# Patient Record
Sex: Female | Born: 1965 | Race: Black or African American | Hispanic: No | Marital: Married | Smoking: Never smoker
Health system: Southern US, Community
[De-identification: ages and names within clinical notes are randomized; demographics above are authoritative.]

## PROBLEM LIST (undated history)

## (undated) DIAGNOSIS — E785 Hyperlipidemia, unspecified: Secondary | ICD-10-CM

## (undated) DIAGNOSIS — K219 Gastro-esophageal reflux disease without esophagitis: Secondary | ICD-10-CM

## (undated) DIAGNOSIS — E119 Type 2 diabetes mellitus without complications: Secondary | ICD-10-CM

## (undated) DIAGNOSIS — Z8759 Personal history of other complications of pregnancy, childbirth and the puerperium: Secondary | ICD-10-CM

## (undated) DIAGNOSIS — I10 Essential (primary) hypertension: Secondary | ICD-10-CM

## (undated) DIAGNOSIS — N979 Female infertility, unspecified: Secondary | ICD-10-CM

## (undated) DIAGNOSIS — M1990 Unspecified osteoarthritis, unspecified site: Secondary | ICD-10-CM

## (undated) DIAGNOSIS — I6529 Occlusion and stenosis of unspecified carotid artery: Secondary | ICD-10-CM

## (undated) DIAGNOSIS — N856 Intrauterine synechiae: Secondary | ICD-10-CM

## (undated) DIAGNOSIS — D250 Submucous leiomyoma of uterus: Secondary | ICD-10-CM

## (undated) DIAGNOSIS — R011 Cardiac murmur, unspecified: Secondary | ICD-10-CM

## (undated) DIAGNOSIS — D649 Anemia, unspecified: Secondary | ICD-10-CM

## (undated) DIAGNOSIS — Z973 Presence of spectacles and contact lenses: Secondary | ICD-10-CM

## (undated) HISTORY — PX: COLONOSCOPY: SHX174

## (undated) HISTORY — DX: Type 2 diabetes mellitus without complications: E11.9

## (undated) HISTORY — DX: Essential (primary) hypertension: I10

## (undated) HISTORY — DX: Unspecified osteoarthritis, unspecified site: M19.90

## (undated) HISTORY — PX: PITUITARY SURGERY: SHX203

## (undated) HISTORY — DX: Cardiac murmur, unspecified: R01.1

## (undated) HISTORY — DX: Hyperlipidemia, unspecified: E78.5

## (undated) HISTORY — DX: Anemia, unspecified: D64.9

---

## 2006-04-01 ENCOUNTER — Inpatient Hospital Stay (HOSPITAL_COMMUNITY): Admission: AD | Admit: 2006-04-01 | Discharge: 2006-04-01 | Payer: Self-pay | Admitting: Obstetrics and Gynecology

## 2006-04-01 ENCOUNTER — Ambulatory Visit: Payer: Self-pay | Admitting: Gynecology

## 2006-04-04 ENCOUNTER — Inpatient Hospital Stay (HOSPITAL_COMMUNITY): Admission: AD | Admit: 2006-04-04 | Discharge: 2006-04-04 | Payer: Self-pay | Admitting: Pediatrics

## 2006-04-07 ENCOUNTER — Inpatient Hospital Stay (HOSPITAL_COMMUNITY): Admission: AD | Admit: 2006-04-07 | Discharge: 2006-04-07 | Payer: Self-pay | Admitting: Family Medicine

## 2006-04-15 ENCOUNTER — Inpatient Hospital Stay (HOSPITAL_COMMUNITY): Admission: AD | Admit: 2006-04-15 | Discharge: 2006-04-15 | Payer: Self-pay | Admitting: Gynecology

## 2006-04-22 ENCOUNTER — Inpatient Hospital Stay (HOSPITAL_COMMUNITY): Admission: AD | Admit: 2006-04-22 | Discharge: 2006-04-22 | Payer: Self-pay | Admitting: Gynecology

## 2006-04-29 ENCOUNTER — Inpatient Hospital Stay (HOSPITAL_COMMUNITY): Admission: AD | Admit: 2006-04-29 | Discharge: 2006-04-29 | Payer: Self-pay | Admitting: Gynecology

## 2006-05-06 ENCOUNTER — Inpatient Hospital Stay (HOSPITAL_COMMUNITY): Admission: AD | Admit: 2006-05-06 | Discharge: 2006-05-06 | Payer: Self-pay | Admitting: Obstetrics and Gynecology

## 2006-05-10 ENCOUNTER — Inpatient Hospital Stay (HOSPITAL_COMMUNITY): Admission: AD | Admit: 2006-05-10 | Discharge: 2006-05-10 | Payer: Self-pay | Admitting: Obstetrics & Gynecology

## 2006-05-17 ENCOUNTER — Inpatient Hospital Stay (HOSPITAL_COMMUNITY): Admission: AD | Admit: 2006-05-17 | Discharge: 2006-05-17 | Payer: Self-pay | Admitting: Obstetrics & Gynecology

## 2006-05-24 ENCOUNTER — Inpatient Hospital Stay (HOSPITAL_COMMUNITY): Admission: AD | Admit: 2006-05-24 | Discharge: 2006-05-24 | Payer: Self-pay | Admitting: Obstetrics & Gynecology

## 2006-06-07 ENCOUNTER — Inpatient Hospital Stay (HOSPITAL_COMMUNITY): Admission: AD | Admit: 2006-06-07 | Discharge: 2006-06-07 | Payer: Self-pay | Admitting: Obstetrics & Gynecology

## 2006-06-21 ENCOUNTER — Inpatient Hospital Stay (HOSPITAL_COMMUNITY): Admission: AD | Admit: 2006-06-21 | Discharge: 2006-06-21 | Payer: Self-pay | Admitting: Family Medicine

## 2006-07-05 ENCOUNTER — Inpatient Hospital Stay (HOSPITAL_COMMUNITY): Admission: AD | Admit: 2006-07-05 | Discharge: 2006-07-05 | Payer: Self-pay | Admitting: Obstetrics & Gynecology

## 2006-07-12 ENCOUNTER — Inpatient Hospital Stay (HOSPITAL_COMMUNITY): Admission: AD | Admit: 2006-07-12 | Discharge: 2006-07-12 | Payer: Self-pay | Admitting: Family Medicine

## 2007-03-20 ENCOUNTER — Emergency Department (HOSPITAL_COMMUNITY): Admission: EM | Admit: 2007-03-20 | Discharge: 2007-03-20 | Payer: Self-pay | Admitting: Emergency Medicine

## 2007-05-01 ENCOUNTER — Inpatient Hospital Stay (HOSPITAL_COMMUNITY): Admission: AD | Admit: 2007-05-01 | Discharge: 2007-05-05 | Payer: Self-pay | Admitting: Obstetrics

## 2007-05-01 ENCOUNTER — Encounter: Payer: Self-pay | Admitting: Obstetrics & Gynecology

## 2007-08-28 ENCOUNTER — Encounter: Admission: RE | Admit: 2007-08-28 | Discharge: 2007-08-28 | Payer: Self-pay | Admitting: Obstetrics

## 2007-09-28 ENCOUNTER — Inpatient Hospital Stay (HOSPITAL_COMMUNITY): Admission: AD | Admit: 2007-09-28 | Discharge: 2007-10-02 | Payer: Self-pay | Admitting: Obstetrics

## 2011-03-06 NOTE — H&P (Signed)
NAMEAVRYL, ROEHM NO.:  [id number]   MEDICAL RECORD NO.:  [id number]          PATIENT TYPE:  INP   LOCATION:  9131                          FACILITY:  [facility]   PHYSICIAN:  Kathreen Cosier, M.D.DATE OF BIRTH:  02/13/1966   DATE OF ADMISSION:  09/28/2007  DATE OF DISCHARGE:                              HISTORY & PHYSICAL   PREOP HISTORY AND PHYSICAL   The patient is a 45 year old gravida 4, para 0-0-3-0, [facility] December 23,  who was admitted yesterday with ruptured membranes.  Patient received  Pitocin stimulation and progressed very slowly.  By 7 a.m. on December  8, she was 9.5 cm with the vertex molded to a 0 station.  She  subsequently became fully dilated and pushed 2 hours with the vertex  molded to a -1 station and it was decided she would be delivered by C-  section for CPD.   PHYSICAL EXAM:  Revealed a well-developed female in labor.  HEENT:  Negative.  LUNGS:  Clear.  HEART:  Regular rhythm.  No murmurs, no gallops.  BREASTS:  No masses.  ABDOMEN:  Term size uterus.  Estimated fetal weight 7 pounds.  PELVIC:  As described above.  EXTREMITIES:  Negative.           ______________________________  Kathreen Cosier, M.D.     BAM/MEDQ  D:  09/29/2007  T:  09/29/2007  Job:  [id number]

## 2011-03-06 NOTE — H&P (Signed)
NAMETOMI, GRANDPRE NO.:  [id number]   MEDICAL RECORD NO.:  [id number]          PATIENT TYPE:  INP   LOCATION:  9170                          [facility]ILITY[facility]  [facility]   PHYSICIAN:  Roseanna Rainbow, M.D.DATE OF BIRTH:  Jan 28, 1966   DATE OF ADMISSION:  09/28/2007  DATE OF DISCHARGE:                              HISTORY & PHYSICAL   CHIEF COMPLAINT:  The patient is a 45 year old gravida 4, para 0, with  an estimated date of confinement of October 14, 2007 with an  intrauterine pregnancy at 37+ weeks, complaining of rupture of  membranes.   HISTORY OF PRESENT ILLNESS:  The patient reports rupture of membranes  were clear. Fluid several hours prior to presentation.   PAST GYNECOLOGIC HISTORY:  Uterine fibroids.   PAST OBSTETRICAL HISTORY:  Voluntary termination of pregnancy,  spontaneous abortion incomplete with a D&C, and ectopic pregnancy  treated with methotrexate.   PAST SURGICAL HISTORY:  Please see the above.   PAST MEDICAL HISTORY:  Malaria.   FAMILY HISTORY:  Noncontributory.   SOCIAL HISTORY:  No tobacco, ethanol, or drug use.   OBSTETRICAL RISK FACTORS:  Advanced maternal age and fetus with right  renal agenesis. Antepartum course onset of care with Dr. Gaynell Face at 18  weeks.   PRENATAL LABORATORY DATA:  Hemoglobin 10.2. Hematocrit 31.4. Platelets  381,000. Blood type O positive. Antibody screen negative. Sickle cell  negative. RPR non-reactive. Rubella immune. Hepatitis B surface antigen  negative. HIV non-reactive. PPD positive. Pap smear normal. GC and  Chlamydia DNA post negative. Quad screen declined. One hour GCT 104. GBS  negative on September 09, 2007.   Ultrasound on July 10, 2007 at 26 weeks 2 day, normal AFI. No  previous. Estimated fetal weight percentile 51st percentile. Again,  right renal agenesis.   PHYSICAL EXAMINATION:  VITAL SIGNS:  Temperature 98, pulse 95,  respiratory rate 18, blood pressure 124/76. Fetal heart  tracing  reassuring. Toco odynometer irregular uterine contractions. Sterile  vaginal examination per the RN, the cervix is 1 cm dilated.   ASSESSMENT:  Intrauterine pregnancy at term, prodromal labor, fetal  heart tracing consistent with fetal well being.   PLAN:  Admission. Augmentation of labor with low-dose Pitocin per  protocol.      Roseanna Rainbow, M.D.  Electronically Signed     LAJ/MEDQ  D:  09/28/2007  T:  09/28/2007  Job:  [id number]

## 2011-03-06 NOTE — Op Note (Signed)
NAMEBEDA, DULA NO.:  [id number]   MEDICAL RECORD NO.:  [id number]          PATIENT TYPE:  INP   LOCATION:  9131                          [facility]  [facility]   PHYSICIAN:  Kathreen Cosier, M.D.DATE OF BIRTH:  1966-08-27   DATE OF PROCEDURE:  09/29/2007  DATE OF DISCHARGE:                               OPERATIVE REPORT   PREOPERATIVE DIAGNOSIS:  Failure to progress in labor.   POSTOPERATIVE DIAGNOSIS:  Failure to progress in labor.   SURGEON:  Kathreen Cosier, M.D.   ANESTHESIA:  Epidural.   PROCEDURE:  The patient placed on the operating room table in supine  position.  Abdomen prepped and draped.  Bladder emptied with a Foley  catheter.  A transverse suprapubic incision made, carried down through  the rectus fascia, fascia cleaned and incised the length of the  incision.  Recti muscles retracted laterally, peritoneum incised  longitudinally.  A transverse incision made into the visceral peritoneum  above the bladder, the bladder mobilized inferiorly.  A transverse lower  uterine incision made.  The patient delivered from the OP position of a  female, Apgar 9 and 9, weighing 6 pounds 13 ounces, the team in  attendance, the fluid clear.  The placenta was fundal and removed  manually and sent to labor and delivery.  The uterine cavity cleaned  with dry laps.  Uterine incision closed in one layer with continuous  suture of #1 chromic.  Hemostasis was satisfactory.  Bladder flap  repaired with 2-0 chromic.  The uterus well-contracted, tubes and  ovaries normal.  Abdomen closed in layers, peritoneum with continuous  suture of 0 chromic, fascia with continuous suture of 0 Dexon, and the  skin closed with a subcuticular stitch of 4-0 Monocryl.  Blood loss 500  mL.  The patient tolerated the procedure well, taken to the recovery  room in good condition.           ______________________________  Kathreen Cosier, M.D.     BAM/MEDQ  D:  09/29/2007   T:  09/29/2007  Job:  [id number]

## 2011-03-09 NOTE — Discharge Summary (Signed)
NAME:  MADELYNNE, LASKER NO.:  [id number]   MEDICAL RECORD NO.:  [id number]          PATIENT TYPE:  OUT   LOCATION:  MRI                          FACILITY:  [facility]   PHYSICIAN:  Kathreen Cosier, M.D.DATE OF BIRTH:  [birth year]   DATE OF ADMISSION:  05/01/2007  DATE OF DISCHARGE:  05/01/2007                               DISCHARGE SUMMARY   The patient is a 45 year old female, had [redacted] weeks gestation with a  history of myomas, and she was admitted with lower abdominal pain,  nausea and vomiting the day prior to admission.  The pain has become  progressively worse, and she was admitted for observation because of  degenerating myoma.  She was given Indocin for 48 hours and also started  on __________ .  She also has a history of chronic constipation, was  given prune juice and Dulcolax suppository.  She was discharged home on  May 05, 2007. Tylenol #3 for pain, to see me in 1 week.   DISCHARGE DIAGNOSIS:  IUP 16 weeks and degenerating myomas.           ______________________________  Kathreen Cosier, M.D.     BAM/MEDQ  D:  05/28/2007  T:  05/28/2007  Job:  [id number]

## 2011-03-09 NOTE — Discharge Summary (Signed)
NAME:  Rebecca Humphrey, Rebecca Humphrey NO.:  [id number]   MEDICAL RECORD NO.:  [id number]          PATIENT TYPE:  INP   LOCATION:  9131                          [facility]   PHYSICIAN:  Kathreen Cosier, M.D.DATE OF BIRTH:  1966/07/17   DATE OF ADMISSION:  09/28/2007  DATE OF DISCHARGE:  10/02/2007                               DISCHARGE SUMMARY   HOSPITAL COURSE:  This patient is a 45 year old primigravida who was  admitted in labor, and she became fully dilated and pushed for a couple  of hours and she underwent a primary low transverse cesarean section  because of failure to progress in labor.  She had a female, Apgar 9, and  weight 6 pounds 13 ounces from the OP position.  Post-op  she did well.  On admission her hemoglobin was 10.5, platelets 400, white count 8.2,  RPR negative, HIV negative.  Her hemoglobin was 6.6.  She was  asymptomatic and started on ferrous sulfate p.o. b.i.d.   She was discharged on the third postoperative day ambulatory, on a  regular diet, to see me in 6 weeks.   DISCHARGE DIAGNOSIS:  Status post primary low transverse cesarean  section at term because of failure to progress in labor.           ______________________________  Kathreen Cosier, M.D.     BAM/MEDQ  D:  10/14/2007  T:  10/15/2007  Job:  [id number]

## 2011-07-30 LAB — CBC
HCT: 19.7 — ABNORMAL LOW
HCT: 30.8 — ABNORMAL LOW
Hemoglobin: 10.5 — ABNORMAL LOW
Hemoglobin: 6.6 — CL
MCHC: 34.1
Platelets: 400
WBC: 8.2

## 2011-07-30 LAB — RPR: RPR Ser Ql: NONREACTIVE

## 2011-08-07 LAB — CBC
HCT: 32 — ABNORMAL LOW
MCHC: 33.4
MCV: 87.2
Platelets: 378
RBC: 3.67 — ABNORMAL LOW
RDW: 13.9
WBC: 11.4 — ABNORMAL HIGH

## 2011-08-07 LAB — URINALYSIS, ROUTINE W REFLEX MICROSCOPIC
Bilirubin Urine: NEGATIVE
Hgb urine dipstick: NEGATIVE
Ketones, ur: NEGATIVE
Protein, ur: NEGATIVE
pH: 7

## 2011-08-07 LAB — DIFFERENTIAL
Basophils Absolute: 0
Neutrophils Relative %: 82 — ABNORMAL HIGH

## 2013-11-05 ENCOUNTER — Other Ambulatory Visit: Payer: Self-pay | Admitting: Nurse Practitioner

## 2013-11-05 ENCOUNTER — Ambulatory Visit
Admission: RE | Admit: 2013-11-05 | Discharge: 2013-11-05 | Disposition: A | Discharge status: Home / Self Care | Payer: No Typology Code available for payment source | Source: Ambulatory Visit | Attending: Nurse Practitioner | Admitting: Nurse Practitioner

## 2013-11-05 ENCOUNTER — Other Ambulatory Visit: Payer: Self-pay | Admitting: *Deleted

## 2013-11-05 DIAGNOSIS — M25519 Pain in unspecified shoulder: Secondary | ICD-10-CM

## 2014-05-14 ENCOUNTER — Other Ambulatory Visit (HOSPITAL_COMMUNITY): Payer: Self-pay | Admitting: Internal Medicine

## 2014-05-14 DIAGNOSIS — I10 Essential (primary) hypertension: Secondary | ICD-10-CM

## 2014-05-14 DIAGNOSIS — I6529 Occlusion and stenosis of unspecified carotid artery: Secondary | ICD-10-CM

## 2014-05-18 ENCOUNTER — Ambulatory Visit (HOSPITAL_COMMUNITY): Payer: No Typology Code available for payment source

## 2014-05-18 ENCOUNTER — Ambulatory Visit (HOSPITAL_COMMUNITY): Admission: RE | Admit: 2014-05-18 | Payer: No Typology Code available for payment source | Source: Ambulatory Visit

## 2014-05-21 ENCOUNTER — Other Ambulatory Visit: Payer: Self-pay | Admitting: Internal Medicine

## 2014-05-21 ENCOUNTER — Ambulatory Visit (HOSPITAL_COMMUNITY)
Admission: RE | Admit: 2014-05-21 | Discharge: 2014-05-21 | Disposition: A | Discharge status: Home / Self Care | Payer: No Typology Code available for payment source | Source: Ambulatory Visit | Attending: Internal Medicine | Admitting: Internal Medicine

## 2014-05-21 DIAGNOSIS — I6529 Occlusion and stenosis of unspecified carotid artery: Secondary | ICD-10-CM | POA: Insufficient documentation

## 2014-05-21 DIAGNOSIS — R0609 Other forms of dyspnea: Secondary | ICD-10-CM | POA: Insufficient documentation

## 2014-05-21 DIAGNOSIS — I359 Nonrheumatic aortic valve disorder, unspecified: Secondary | ICD-10-CM

## 2014-05-21 DIAGNOSIS — I10 Essential (primary) hypertension: Secondary | ICD-10-CM | POA: Insufficient documentation

## 2014-05-21 DIAGNOSIS — N6310 Unspecified lump in the right breast, unspecified quadrant: Secondary | ICD-10-CM

## 2014-05-21 DIAGNOSIS — R0989 Other specified symptoms and signs involving the circulatory and respiratory systems: Principal | ICD-10-CM | POA: Insufficient documentation

## 2014-05-21 HISTORY — PX: TRANSTHORACIC ECHOCARDIOGRAM: SHX275

## 2014-05-21 NOTE — Progress Notes (Signed)
Bilateral carotid artery duplex completed:  1-39% ICA stenosis.  Vertebral artery flow is antegrade.     

## 2014-05-21 NOTE — Progress Notes (Signed)
  Echocardiogram 2D Echocardiogram has been performed.  Donata Clay 05/21/2014, 10:46 AM

## 2014-06-07 ENCOUNTER — Ambulatory Visit
Admission: RE | Admit: 2014-06-07 | Discharge: 2014-06-07 | Disposition: A | Discharge status: Home / Self Care | Payer: No Typology Code available for payment source | Source: Ambulatory Visit | Attending: Internal Medicine | Admitting: Internal Medicine

## 2014-06-07 DIAGNOSIS — N6310 Unspecified lump in the right breast, unspecified quadrant: Secondary | ICD-10-CM

## 2014-06-23 ENCOUNTER — Ambulatory Visit: Payer: No Typology Code available for payment source | Admitting: Obstetrics & Gynecology

## 2014-07-08 ENCOUNTER — Ambulatory Visit (INDEPENDENT_AMBULATORY_CARE_PROVIDER_SITE_OTHER): Payer: No Typology Code available for payment source | Admitting: Obstetrics & Gynecology

## 2014-07-08 ENCOUNTER — Encounter: Payer: Self-pay | Admitting: Obstetrics & Gynecology

## 2014-07-08 VITALS — Temp 99.5°F | Ht 63.0 in | Wt 196.0 lb

## 2014-07-08 DIAGNOSIS — Z01419 Encounter for gynecological examination (general) (routine) without abnormal findings: Secondary | ICD-10-CM

## 2014-07-08 DIAGNOSIS — N912 Amenorrhea, unspecified: Secondary | ICD-10-CM

## 2014-07-08 DIAGNOSIS — N951 Menopausal and female climacteric states: Secondary | ICD-10-CM

## 2014-07-08 LAB — POCT URINE PREGNANCY: Preg Test, Ur: NEGATIVE

## 2014-07-08 MED ORDER — NORETHIN ACE-ETH ESTRAD-FE 1-20 MG-MCG(24) PO TABS: 1.0000 | ORAL_TABLET | Freq: Every day | ORAL | Status: DC

## 2014-07-08 NOTE — Patient Instructions (Signed)
Perimenopause Perimenopause is the time when your body begins to move into the menopause (no menstrual period for 12 straight months). It is a natural process. Perimenopause can begin 2-8 years before the menopause and usually lasts for 1 year after the menopause. During this time, your ovaries may or may not produce an egg. The ovaries vary in their production of estrogen and progesterone hormones each month. This can cause irregular menstrual periods, difficulty getting pregnant, vaginal bleeding between periods, and uncomfortable symptoms. CAUSES  Irregular production of the ovarian hormones, estrogen and progesterone, and not ovulating every month.  Other causes include:  Tumor of the pituitary gland in the brain.  Medical disease that affects the ovaries.  Radiation treatment.  Chemotherapy.  Unknown causes.  Heavy smoking and excessive alcohol intake can bring on perimenopause sooner. SIGNS AND SYMPTOMS   Hot flashes.  Night sweats.  Irregular menstrual periods.  Decreased sex drive.  Vaginal dryness.  Headaches.  Mood swings.  Depression.  Memory problems.  Irritability.  Tiredness.  Weight gain.  Trouble getting pregnant.  The beginning of losing bone cells (osteoporosis).  The beginning of hardening of the arteries (atherosclerosis). DIAGNOSIS  Your health care provider will make a diagnosis by analyzing your age, menstrual history, and symptoms. He or she will do a physical exam and note any changes in your body, especially your female organs. Female hormone tests may or may not be helpful depending on the amount of female hormones you produce and when you produce them. However, other hormone tests may be helpful to rule out other problems. TREATMENT  In some cases, no treatment is needed. The decision on whether treatment is necessary during the perimenopause should be made by you and your health care provider based on how the symptoms are affecting you  and your lifestyle. Various treatments are available, such as:  Treating individual symptoms with a specific medicine for that symptom.  Herbal medicines that can help specific symptoms.  Counseling.  Group therapy. HOME CARE INSTRUCTIONS   Keep track of your menstrual periods (when they occur, how heavy they are, how long between periods, and how long they last) as well as your symptoms and when they started.  Only take over-the-counter or prescription medicines as directed by your health care provider.  Sleep and rest.  Exercise.  Eat a diet that contains calcium (good for your bones) and soy (acts like the estrogen hormone).  Do not smoke.  Avoid alcoholic beverages.  Take vitamin supplements as recommended by your health care provider. Taking vitamin E may help in certain cases.  Take calcium and vitamin D supplements to help prevent bone loss.  Group therapy is sometimes helpful.  Acupuncture may help in some cases. SEEK MEDICAL CARE IF:   You have questions about any symptoms you are having.  You need a referral to a specialist (gynecologist, psychiatrist, or psychologist). SEEK IMMEDIATE MEDICAL CARE IF:   You have vaginal bleeding.  Your period lasts longer than 8 days.  Your periods are recurring sooner than 21 days.  You have bleeding after intercourse.  You have severe depression.  You have pain when you urinate.  You have severe headaches.  You have vision problems. Document Released: 11/15/2004 Document Revised: 07/29/2013 Document Reviewed: 05/07/2013 [facility] Patient Information 2015 [place], [place]. This information is not intended to replace advice given to you by your health care provider. Make sure you discuss any questions you have with your health care provider. Perimenopause Perimenopause is the time  when your body begins to move into the menopause (no menstrual period for 12 straight months). It is a natural process. Perimenopause can  begin 2-8 years before the menopause and usually lasts for 1 year after the menopause. During this time, your ovaries may or may not produce an egg. The ovaries vary in their production of estrogen and progesterone hormones each month. This can cause irregular menstrual periods, difficulty getting pregnant, vaginal bleeding between periods, and uncomfortable symptoms. CAUSES  Irregular production of the ovarian hormones, estrogen and progesterone, and not ovulating every month.  Other causes include:  Tumor of the pituitary gland in the brain.  Medical disease that affects the ovaries.  Radiation treatment.  Chemotherapy.  Unknown causes.  Heavy smoking and excessive alcohol intake can bring on perimenopause sooner. SIGNS AND SYMPTOMS   Hot flashes.  Night sweats.  Irregular menstrual periods.  Decreased sex drive.  Vaginal dryness.  Headaches.  Mood swings.  Depression.  Memory problems.  Irritability.  Tiredness.  Weight gain.  Trouble getting pregnant.  The beginning of losing bone cells (osteoporosis).  The beginning of hardening of the arteries (atherosclerosis). DIAGNOSIS  Your health care provider will make a diagnosis by analyzing your age, menstrual history, and symptoms. He or she will do a physical exam and note any changes in your body, especially your female organs. Female hormone tests may or may not be helpful depending on the amount of female hormones you produce and when you produce them. However, other hormone tests may be helpful to rule out other problems. TREATMENT  In some cases, no treatment is needed. The decision on whether treatment is necessary during the perimenopause should be made by you and your health care provider based on how the symptoms are affecting you and your lifestyle. Various treatments are available, such as:  Treating individual symptoms with a specific medicine for that symptom.  Herbal medicines that can help  specific symptoms.  Counseling.  Group therapy. HOME CARE INSTRUCTIONS   Keep track of your menstrual periods (when they occur, how heavy they are, how long between periods, and how long they last) as well as your symptoms and when they started.  Only take over-the-counter or prescription medicines as directed by your health care provider.  Sleep and rest.  Exercise.  Eat a diet that contains calcium (good for your bones) and soy (acts like the estrogen hormone).  Do not smoke.  Avoid alcoholic beverages.  Take vitamin supplements as recommended by your health care provider. Taking vitamin E may help in certain cases.  Take calcium and vitamin D supplements to help prevent bone loss.  Group therapy is sometimes helpful.  Acupuncture may help in some cases. SEEK MEDICAL CARE IF:   You have questions about any symptoms you are having.  You need a referral to a specialist (gynecologist, psychiatrist, or psychologist). SEEK IMMEDIATE MEDICAL CARE IF:   You have vaginal bleeding.  Your period lasts longer than 8 days.  Your periods are recurring sooner than 21 days.  You have bleeding after intercourse.  You have severe depression.  You have pain when you urinate.  You have severe headaches.  You have vision problems. Document Released: 11/15/2004 Document Revised: 07/29/2013 Document Reviewed: 05/07/2013 [facility] Patient Information 2015 [place], [place]. This information is not intended to replace advice given to you by your health care provider. Make sure you discuss any questions you have with your health care provider.

## 2014-07-08 NOTE — Progress Notes (Signed)
Subjective:     Rebecca Humphrey is a 48 y.o. female here for a routine exam.  Current complaints: skipped menses   Personal health questionnaire:  Is patient Ashkenazi Jewish, have a family history of breast and/or ovarian cancer: no Is there a family history of uterine cancer diagnosed at age < 63, gastrointestinal cancer, urinary tract cancer, family member who is a Field seismologist syndrome-associated carrier: no Is the patient overweight and hypertensive, family history of diabetes, personal history of gestational diabetes or PCOS: yes Is patient over 2, have PCOS,  family history of premature CHD under age 25, diabetes, smoke, have hypertension or peripheral artery disease:  no At any time, has a partner hit, kicked or otherwise hurt or frightened you?: no Over the past 2 weeks, have you felt down, depressed or hopeless?: no Over the past 2 weeks, have you felt little interest or pleasure in doing things?:no   Gynecologic History Patient's last menstrual period was 10/24/2013. Last Pap results were: normal Last mammogram: 8/15. Results were: normal  Obstetric History OB History  Gravida Para Term Preterm AB SAB TAB Ectopic Multiple Living  4 1 1  3 2  1  1     # Outcome Date GA Lbr Len/2nd Weight Sex Delivery Anes PTL Lv  4 TRM 09/29/07 [redacted]w[redacted]d  2.977 kg (6 lb 9 oz) F LTCS Spinal  Y  3 ECT         N  2 SAB         N  1 SAB         N      Past Medical History  Diagnosis Date  . Hypertension     Past Surgical History  Procedure Laterality Date  . Cesarean section      Current outpatient prescriptions:aspirin 81 MG tablet, Take 81 mg by mouth daily., Disp: , Rfl: ;  hydrochlorothiazide (HYDRODIURIL) 25 MG tablet, Take 25 mg by mouth daily., Disp: , Rfl: ;  omeprazole (PRILOSEC) 20 MG capsule, Take 20 mg by mouth daily., Disp: , Rfl: ;  Norethindrone Acetate-Ethinyl Estrad-FE (LOESTRIN 24 FE) 1-20 MG-MCG(24) tablet, Take 1 tablet by mouth daily., Disp: 1 Package, Rfl: 4 No Known  Allergies  History  Substance Use Topics  . Smoking status: Never Smoker   . Smokeless tobacco: Never Used  . Alcohol Use: No    Family History  Problem Relation Age of Onset  . Diabetes Mother   . Heart disease Father   . Hypertension Father       Review of Systems  Constitutional: negative for fatigue and weight loss Respiratory: negative for cough and wheezing Cardiovascular: negative for chest pain, fatigue and palpitations Gastrointestinal: negative for abdominal pain and change in bowel habits Musculoskeletal:negative for myalgias Neurological: negative for gait problems and tremors Behavioral/Psych: negative for abusive relationship, depression Endocrine: negative for temperature intolerance   Genitourinary:positive for irregular menstrual periods; negative for genital lesions, hot flashes, sexual problems and vaginal discharge Integument/breast: negative for breast lump, breast tenderness, nipple discharge and skin lesion(s)    Objective:       Temp(Src) 99.5 F (37.5 C)  Ht 5\' 3"  (1.6 m)  Wt 88.905 kg (196 lb)  BMI 34.73 kg/m2  LMP 10/24/2013 General:   alert  Skin:   no rash or abnormalities  Lungs:   clear to auscultation bilaterally  Heart:   regular rate and rhythm, S1, S2 normal, no murmur, click, rub or gallop  Breasts:   normal without suspicious masses,  skin or nipple changes or axillary nodes  Abdomen:  normal findings: no organomegaly, soft, non-tender and no hernia  Pelvis:  External genitalia: normal general appearance Urinary system: urethral meatus normal and bladder without fullness, nontender Vaginal: normal without tenderness, induration or masses Cervix: normal appearance Adnexa: normal bimanual exam Uterus: anteverted and non-tender, normal size   Lab Review Urine pregnancy test Labs reviewed no Radiologic studies reviewed no     Assessment:    Healthy female exam.    Plan:    Education reviewed: calcium supplements, low fat,  low cholesterol diet and weight bearing exercise.   Meds ordered this encounter  Medications  . hydrochlorothiazide (HYDRODIURIL) 25 MG tablet    Sig: Take 25 mg by mouth daily.  Marland Kitchen aspirin 81 MG tablet    Sig: Take 81 mg by mouth daily.  Marland Kitchen omeprazole (PRILOSEC) 20 MG capsule    Sig: Take 20 mg by mouth daily.  . Norethindrone Acetate-Ethinyl Estrad-FE (LOESTRIN 24 FE) 1-20 MG-MCG(24) tablet    Sig: Take 1 tablet by mouth daily.    Dispense:  1 Package    Refill:  4   Orders Placed This Encounter  Procedures  . US Pelvis Complete    Standing Status: Future     Number of Occurrences:      Standing Expiration Date: 09/08/2015    Order Specific Question:  Reason for Exam (SYMPTOM  OR DIAGNOSIS REQUIRED)    Answer:  amenorrhea    Order Specific Question:  Preferred imaging location?    Answer:  [facility]  . [facility]  . Prolactin  . Estradiol  . POCT urine pregnancy   Need to obtain previous records Possible management options include: attempt to cycle with [facility] Follow up as needed.

## 2014-07-09 LAB — FOLLICLE STIMULATING HORMONE: FSH: 4.6 m[IU]/mL

## 2014-07-09 LAB — ESTRADIOL: ESTRADIOL: 101.7 pg/mL

## 2014-07-09 LAB — PROLACTIN: PROLACTIN: 23.5 ng/mL

## 2014-07-12 LAB — PAP IG AND HPV HIGH-RISK: HPV DNA HIGH RISK: NOT DETECTED

## 2014-07-15 ENCOUNTER — Other Ambulatory Visit: Payer: Self-pay | Admitting: Obstetrics & Gynecology

## 2014-07-15 DIAGNOSIS — N912 Amenorrhea, unspecified: Secondary | ICD-10-CM

## 2014-07-16 ENCOUNTER — Ambulatory Visit (HOSPITAL_COMMUNITY)
Admission: RE | Admit: 2014-07-16 | Discharge: 2014-07-16 | Disposition: A | Discharge status: Home / Self Care | Payer: No Typology Code available for payment source | Source: Ambulatory Visit | Attending: Obstetrics & Gynecology | Admitting: Obstetrics & Gynecology

## 2014-07-16 DIAGNOSIS — D251 Intramural leiomyoma of uterus: Secondary | ICD-10-CM | POA: Diagnosis not present

## 2014-07-16 DIAGNOSIS — N912 Amenorrhea, unspecified: Secondary | ICD-10-CM | POA: Insufficient documentation

## 2014-07-16 DIAGNOSIS — N854 Malposition of uterus: Secondary | ICD-10-CM | POA: Diagnosis not present

## 2014-07-21 ENCOUNTER — Ambulatory Visit (INDEPENDENT_AMBULATORY_CARE_PROVIDER_SITE_OTHER): Payer: No Typology Code available for payment source | Admitting: Obstetrics & Gynecology

## 2014-07-21 ENCOUNTER — Encounter: Payer: Self-pay | Admitting: Obstetrics & Gynecology

## 2014-07-21 VITALS — BP 128/81 | HR 66 | Temp 98.2°F | Wt 195.0 lb

## 2014-07-21 DIAGNOSIS — N979 Female infertility, unspecified: Secondary | ICD-10-CM

## 2014-07-21 DIAGNOSIS — D259 Leiomyoma of uterus, unspecified: Secondary | ICD-10-CM

## 2014-07-21 NOTE — Patient Instructions (Signed)
Infertility WHAT IS INFERTILITY?  Infertility is usually defined as not being able to get pregnant after trying for one year of regular sexual intercourse without the use of contraceptives. Or not being able to carry a pregnancy to term and have a baby. The infertility rate in the [place] is around 10%. Pregnancy is the result of a chain of events. A woman must release an egg from one of her ovaries (ovulation). The egg must be fertilized by the female sperm. Then it travels through a fallopian tube into the uterus (womb), where it attaches to the wall of the uterus and grows. A man must have enough sperm, and the sperm must join with (fertilize) the egg along the way, at the proper time. The fertilized egg must then become attached to the inside of the uterus. While this may seem simple, many things can happen to prevent pregnancy from occurring.  WHOSE PROBLEM IS IT?  About 20% of infertility cases are due to problems with the man (female factors) and 65% are due to problems with the woman (female factors). Other cases are due to a combination of female and female factors or to unknown causes.  WHAT CAUSES INFERTILITY IN MEN?  Infertility in men is often caused by problems with making enough normal sperm or getting the sperm to reach the egg. Problems with sperm may exist from birth or develop later in life, due to illness or injury. Some men produce no sperm, or produce too few sperm (oligospermia). Other problems include:  Sexual dysfunction.  Hormonal or endocrine problems.  Age. Female fertility decreases with age, but not at as young an age as female fertility.  Infection.  Congenital problems. Birth defect, such as absence of the tubes that carry the sperm (vas deferens).  Genetic/chromosomal problems.  Antisperm antibody problems.  Retrograde ejaculation (sperm go into the bladder).  Varicoceles, spematoceles, or tumors of the testicles.  Lifestyle can influence the number and  quality of a man's sperm.  Alcohol and drugs can temporarily reduce sperm quality.  Environmental toxins, including pesticides and lead, may cause some cases of infertility in men. WHAT CAUSES INFERTILITY IN WOMEN?   Problems with ovulation account for most infertility in women. Without ovulation, eggs are not available to be fertilized.  Signs of problems with ovulation include irregular menstrual periods or no periods at all.  Simple lifestyle factors, including stress, diet, or athletic training, can affect a woman's hormonal balance.  Age. Fertility begins to decrease in women in the early 76s and is worse after age 71.  Much less often, a hormonal imbalance from a serious medical problem, such as a pituitary gland tumor, thyroid or other chronic medical disease, can cause ovulation problems.  Pelvic infections.  Polycystic ovary syndrome (increase in female hormones, unable to ovulate).  Alcohol or illegal drugs.  Environmental toxins, radiation, pesticides, and certain chemicals.  Aging is an important factor in female infertility.  The ability of a woman's ovaries to produce eggs declines with age, especially after age 27. About one third of couples where the woman is over 21 will have problems with fertility.  By the time she reaches menopause when her monthly periods stop for good, a woman can no longer produce eggs or become pregnant.  Other problems can also lead to infertility in women. If the fallopian tubes are blocked at one or both ends, the egg cannot travel through the tubes into the uterus. Scar tissue (adhesions) in the pelvis may cause blocked  tubes. This may result from pelvic inflammatory disease, endometriosis, or surgery for an ectopic pregnancy (fertilized egg implanted outside the uterus) or any pelvic or abdominal surgery causing adhesions.  Fibroid tumors or polyps of the uterus.  Congenital (birth defect) abnormalities of the uterus.  Infection of the  cervix (cervicitis).  Cervical stenosis (narrowing).  Abnormal cervical mucus.  Polycystic ovary syndrome.  Having sexual intercourse too often (every other day or 4 to 5 times a week).  Obesity.  Anorexia.  Poor nutrition.  Over exercising, with loss of body fat.  DES. Your mother received diethylstilbesterol hormone when pregnant with you. HOW IS INFERTILITY TESTED?  If you have been trying to have a baby without success, you may want to seek medical help. You should not wait for one year of trying before seeing a health care provider if:  You are over 35.  You have reason to believe that there may be a fertility problem. A medical evaluation may determine the reasons for a couple's infertility. Usually this process begins with:  Physical exams.  Medical histories of both partners.  Sexual histories of both partners. If there is no obvious problem, like improperly timed intercourse or absence of ovulation, tests may be needed.   For a man, testing usually begins with tests of his semen to look at:  The number of sperm.  The shape of sperm.  Movement of his sperm.  Taking a complete medical and surgical history.  Physical examination.  Check for infection of the female reproductive organs. Sometimes hormone tests are done.   For a woman, the first step in testing is to find out if she is ovulating each month. There are several ways to do this. For example, she can keep track of changes in her morning body temperature and in the texture of her cervical mucus. Another tool is a home ovulation test kit, which can be bought at drug or grocery stores.  Checks of ovulation can also be done in the doctor's office, using blood tests for hormone levels or ultrasound tests of the ovaries. If the woman is ovulating, more tests will need to be done. Some common female tests include:  Hysterosalpingogram: An x-ray of the fallopian tubes and uterus after they are injected with  dye. It shows if the tubes are open and shows the shape of the uterus.  Laparoscopy: An exam of the tubes and other female organs for disease. A lighted tube called a laparoscope is used to see inside the abdomen.  Endometrial biopsy: Sample of uterus tissue taken on the first day of the menstrual period, to see if the tissue indicates you are ovulating.  Transvaginal ultrasound: Examines the female organs.  Hysteroscopy: Uses a lighted tube to examine the cervix and inside the uterus, to see if there are any abnormalities inside the uterus. TREATMENT  Depending on the test results, different treatments can be suggested. The type of treatment depends on the cause. 85 to 90% of infertility cases are treated with drugs or surgery.   Various fertility drugs may be used for women with ovulation problems. It is important to talk with your caregiver about the drug to be used. You should understand the drug's benefits and side effects. Depending on the type of fertility drug and the dosage of the drug used, multiple births (twins or multiples) can occur in some women.  If needed, surgery can be done to repair damage to a woman's ovaries, fallopian tubes, cervix, or uterus.  Surgery  or medical treatment for endometriosis or polycystic ovary syndrome. Sometimes a man has an infertility problem that can be corrected with medicine or by surgery.  Intrauterine insemination (IUI) of sperm, timed with ovulation.  Change in lifestyle, if that is the cause (lose weight, increase exercise, and stop smoking, drinking excessively, or taking illegal drugs).  Other types of surgery:  Removing growths inside and on the uterus.  Removing scar tissue from inside of the uterus.  Fixing blocked tubes.  Removing scar tissue in the pelvis and around the female organs. WHAT IS ASSISTED REPRODUCTIVE TECHNOLOGY (ART)?  Assisted reproductive technology (ART) is another form of special methods used to help infertile  couples. ART involves handling both the woman's eggs and the man's sperm. Success rates vary and depend on many factors. ART can be expensive and time-consuming. But ART has made it possible for many couples to have children that otherwise would not have been conceived. Some methods are listed below:  In vitro fertilization (IVF). IVF is often used when a woman's fallopian tubes are blocked or when a man has low sperm counts. A drug is used to stimulate the ovaries to produce multiple eggs. Once mature, the eggs are removed and placed in a culture dish with the man's sperm for fertilization. After about 40 hours, the eggs are examined to see if they have become fertilized by the sperm and are dividing into cells. These fertilized eggs (embryos) are then placed in the woman's uterus. This bypasses the fallopian tubes.  Gamete intrafallopian transfer (GIFT) is similar to IVF, but used when the woman has at least one normal fallopian tube. Three to five eggs are placed in the fallopian tube, along with the man's sperm, for fertilization inside the woman's body.  Zygote intrafallopian transfer (ZIFT), also called tubal embryo transfer, combines IVF and GIFT. The eggs retrieved from the woman's ovaries are fertilized in the lab and placed in the fallopian tubes rather than in the uterus.  ART procedures sometimes involve the use of donor eggs (eggs from another woman) or previously frozen embryos. Donor eggs may be used if a woman has impaired ovaries or has a genetic disease that could be passed on to her baby.  When performing ART, you are at higher risk for resulting in multiple pregnancies, twins, triplets or more.  Intracytoplasma sperm injection is a procedure that injects a single sperm into the egg to fertilize it.  Embryo transplant is a procedure that starts after growing an embryo in a special media (chemical solution) developed to keep the embryo alive for 2 to 5 days, and then transplanting it  into the uterus. In cases where a cause cannot be found and pregnancy does not occur, adoption may be a consideration. Document Released: 10/11/2003 Document Revised: 12/31/2011 Document Reviewed: 09/06/2009 [facility] Patient Information 2015 [place], [place]. This information is not intended to replace advice given to you by your health care provider. Make sure you discuss any questions you have with your health care provider.

## 2014-07-23 ENCOUNTER — Telehealth: Payer: Self-pay

## 2014-07-23 DIAGNOSIS — D219 Benign neoplasm of connective and other soft tissue, unspecified: Secondary | ICD-10-CM | POA: Insufficient documentation

## 2014-07-23 NOTE — Progress Notes (Signed)
Patient ID: Rebecca Humphrey, female   DOB: 05/15/1966, 48 y.o.   MRN: [id number]  Chief Complaint  Patient presents with  . Follow-up    u/s    HPI Rebecca Humphrey is a 48 y.o. female.  She has recently had a withdrawal bleed/menses.  The ultrasound showed a small, fibroid uterus. HPI  Past Medical History  Diagnosis Date  . Hypertension     Past Surgical History  Procedure Laterality Date  . Cesarean section      Family History  Problem Relation Age of Onset  . Diabetes Mother   . Heart disease Father   . Hypertension Father     Social History History  Substance Use Topics  . Smoking status: Never Smoker   . Smokeless tobacco: Never Used  . Alcohol Use: No    No Known Allergies  Current Outpatient Prescriptions  Medication Sig Dispense Refill  . aspirin 81 MG tablet Take 81 mg by mouth daily.      . hydrochlorothiazide (HYDRODIURIL) 25 MG tablet Take 25 mg by mouth daily.      . Norethindrone Acetate-Ethinyl Estrad-FE (LOESTRIN 24 FE) 1-20 MG-MCG(24) tablet Take 1 tablet by mouth daily.  1 Package  4  . omeprazole (PRILOSEC) 20 MG capsule Take 20 mg by mouth daily.      . potassium chloride (K-DUR) 10 MEQ tablet        No current facility-administered medications for this visit.    Review of Systems Review of Systems Constitutional: negative for fatigue and weight loss Respiratory: negative for cough and wheezing Cardiovascular: negative for chest pain, fatigue and palpitations Gastrointestinal: negative for abdominal pain and change in bowel habits Genitourinary:negative Integument/breast: negative for nipple discharge Musculoskeletal:negative for myalgias Neurological: negative for gait problems and tremors Behavioral/Psych: negative for abusive relationship, depression Endocrine: negative for temperature intolerance     Blood pressure 128/81, pulse 66, temperature 98.2 F (36.8 C), weight 88.451 kg (195 lb), last menstrual period  10/24/2013.  Physical Exam Physical Exam   50% of 15 min visit spent on counseling and coordination of care.   Data Reviewed Pelvic U/S, labs  Assessment    Small fibroid The patient is considering her fertility options--counseled re: the impact of age; her partner's age is >50    Plan    Orders Placed This Encounter  Procedures  . Anti mullerian hormone  . Ambulatory referral to Endocrinology    Referral Priority:  Routine    Referral Type:  Consultation    Referral Reason:  Specialty Services Required    Requested Specialty:  Reproductive Endocrinology and Infertility    Number of Visits Requested:  1    Follow up as needed.         JACKSON-MOORE,Pegeen Stiger A 07/23/2014, 5:35 PM

## 2014-07-23 NOTE — Telephone Encounter (Signed)
old patient that Dr. Charlett Lango office was ttrying to reach her to schedule appt with them and to call Seth Bake at [phone] today

## 2014-07-24 LAB — ANTI MULLERIAN HORMONE

## 2014-08-23 ENCOUNTER — Encounter: Payer: Self-pay | Admitting: Obstetrics & Gynecology

## 2014-08-31 ENCOUNTER — Other Ambulatory Visit: Payer: No Typology Code available for payment source

## 2014-09-01 ENCOUNTER — Other Ambulatory Visit: Payer: No Typology Code available for payment source

## 2014-09-01 DIAGNOSIS — N979 Female infertility, unspecified: Secondary | ICD-10-CM

## 2014-09-02 LAB — PROLACTIN: Prolactin: 25.7 ng/mL

## 2014-10-18 ENCOUNTER — Encounter: Payer: Self-pay | Admitting: *Deleted

## 2014-10-19 ENCOUNTER — Encounter: Payer: Self-pay | Admitting: Obstetrics & Gynecology

## 2014-11-19 ENCOUNTER — Encounter (HOSPITAL_BASED_OUTPATIENT_CLINIC_OR_DEPARTMENT_OTHER): Payer: Self-pay | Admitting: *Deleted

## 2014-11-23 ENCOUNTER — Encounter (HOSPITAL_BASED_OUTPATIENT_CLINIC_OR_DEPARTMENT_OTHER): Payer: Self-pay | Admitting: *Deleted

## 2014-11-23 NOTE — Progress Notes (Signed)
NPO AFTER MN WITH EXCEPTION CLEAR LIQUIDS UNTIL 0830 (NO CREAM/ MILK PRODUCTS).  ARRIVE AT 1300.  NEEDS ISTAT AND EKG. WILL TAKE PRILOSEC AM DOS W/ SIPS OF WATER. 

## 2014-11-25 ENCOUNTER — Ambulatory Visit (HOSPITAL_BASED_OUTPATIENT_CLINIC_OR_DEPARTMENT_OTHER)
Admission: RE | Admit: 2014-11-25 | Payer: No Typology Code available for payment source | Source: Ambulatory Visit | Admitting: Obstetrics and Gynecology

## 2014-11-25 HISTORY — DX: Gastro-esophageal reflux disease without esophagitis: K21.9

## 2014-11-25 HISTORY — DX: Submucous leiomyoma of uterus: D25.0

## 2014-11-25 HISTORY — DX: Personal history of other complications of pregnancy, childbirth and the puerperium: Z87.59

## 2014-11-25 HISTORY — DX: Occlusion and stenosis of unspecified carotid artery: I65.29

## 2014-11-25 HISTORY — DX: Intrauterine synechiae: N85.6

## 2014-11-25 HISTORY — DX: Female infertility, unspecified: N97.9

## 2014-11-25 HISTORY — DX: Presence of spectacles and contact lenses: Z97.3

## 2014-11-25 SURGERY — HYSTEROSCOPY
Anesthesia: General

## 2015-01-20 ENCOUNTER — Other Ambulatory Visit: Payer: Self-pay | Admitting: *Deleted

## 2015-01-20 DIAGNOSIS — Z30011 Encounter for initial prescription of contraceptive pills: Secondary | ICD-10-CM

## 2015-01-20 MED ORDER — NORETHIN ACE-ETH ESTRAD-FE 1-20 MG-MCG(24) PO TABS: 1.0000 | ORAL_TABLET | Freq: Every day | ORAL | Status: DC

## 2015-01-20 NOTE — Progress Notes (Signed)
Patient sent in a refill request from the pharmacy. Patient requesting a refill on her birth control. Patient was in office last for an appointment 07-22-15. Refills given to last until annual exam due.

## 2015-08-27 ENCOUNTER — Other Ambulatory Visit: Payer: Self-pay | Admitting: Internal Medicine

## 2015-08-27 DIAGNOSIS — E2839 Other primary ovarian failure: Secondary | ICD-10-CM

## 2015-09-26 ENCOUNTER — Ambulatory Visit
Admission: RE | Admit: 2015-09-26 | Discharge: 2015-09-26 | Disposition: A | Discharge status: Home / Self Care | Payer: Commercial Managed Care - PPO | Source: Ambulatory Visit | Attending: Internal Medicine | Admitting: Internal Medicine

## 2015-09-26 DIAGNOSIS — E2839 Other primary ovarian failure: Secondary | ICD-10-CM

## 2016-02-13 ENCOUNTER — Encounter: Payer: Self-pay | Admitting: Gastroenterology

## 2016-04-03 ENCOUNTER — Ambulatory Visit (AMBULATORY_SURGERY_CENTER): Payer: Self-pay | Admitting: *Deleted

## 2016-04-03 ENCOUNTER — Encounter: Payer: Self-pay | Admitting: *Deleted

## 2016-04-03 VITALS — Ht 63.0 in | Wt 203.0 lb

## 2016-04-03 DIAGNOSIS — Z1211 Encounter for screening for malignant neoplasm of colon: Secondary | ICD-10-CM

## 2016-04-03 NOTE — Progress Notes (Signed)
No egg or soy allergy known to patient  No issues with past sedation with any surgeries  or procedures, no intubation problems  No diet pills per patient No home 02 use per patient  No blood thinners per patient  Pt denies issues with constipation  emmi declined

## 2016-04-04 ENCOUNTER — Encounter: Payer: Self-pay | Admitting: Gastroenterology

## 2016-04-13 ENCOUNTER — Ambulatory Visit (AMBULATORY_SURGERY_CENTER): Payer: Commercial Managed Care - PPO | Admitting: Gastroenterology

## 2016-04-13 ENCOUNTER — Encounter: Payer: Self-pay | Admitting: Gastroenterology

## 2016-04-13 VITALS — BP 124/74 | HR 49 | Temp 96.2°F | Resp 11 | Ht 63.0 in | Wt 203.0 lb

## 2016-04-13 DIAGNOSIS — D123 Benign neoplasm of transverse colon: Secondary | ICD-10-CM | POA: Diagnosis not present

## 2016-04-13 DIAGNOSIS — Z1211 Encounter for screening for malignant neoplasm of colon: Secondary | ICD-10-CM | POA: Diagnosis present

## 2016-04-13 DIAGNOSIS — D124 Benign neoplasm of descending colon: Secondary | ICD-10-CM | POA: Diagnosis not present

## 2016-04-13 MED ORDER — SODIUM CHLORIDE 0.9 % IV SOLN: 500.0000 mL | INTRAVENOUS | Status: DC

## 2016-04-13 NOTE — Patient Instructions (Signed)
YOU HAD AN ENDOSCOPIC PROCEDURE TODAY AT [facility]:   Refer to the procedure report that was given to you for any specific questions about what was found during the examination.  If the procedure report does not answer your questions, please call your gastroenterologist to clarify.  If you requested that your care partner not be given the details of your procedure findings, then the procedure report has been included in a sealed envelope for you to review at your convenience later.  YOU SHOULD EXPECT: Some feelings of bloating in the abdomen. Passage of more gas than usual.  Walking can help get rid of the air that was put into your GI tract during the procedure and reduce the bloating. If you had a lower endoscopy (such as a colonoscopy or flexible sigmoidoscopy) you may notice spotting of blood in your stool or on the toilet paper. If you underwent a bowel prep for your procedure, you may not have a normal bowel movement for a few days.  Please Note:  You might notice some irritation and congestion in your nose or some drainage.  This is from the oxygen used during your procedure.  There is no need for concern and it should clear up in a day or so.  SYMPTOMS TO REPORT IMMEDIATELY:   Following lower endoscopy (colonoscopy or flexible sigmoidoscopy):  Excessive amounts of blood in the stool  Significant tenderness or worsening of abdominal pains  Swelling of the abdomen that is new, acute  Fever of 100F or higher   For urgent or emergent issues, a gastroenterologist can be reached at any hour by calling [phone].   DIET: Your first meal following the procedure should be a small meal and then it is ok to progress to your normal diet. Heavy or fried foods are harder to digest and may make you feel nauseous or bloated.  Likewise, meals heavy in dairy and vegetables can increase bloating.  Drink plenty of fluids but you should avoid alcoholic beverages for 24 hours. Try to  increase the fiber n your diet, and drink plenty of water.  ACTIVITY:  You should plan to take it easy for the rest of today and you should NOT DRIVE or use heavy machinery until tomorrow (because of the sedation medicines used during the test).    FOLLOW UP: Our staff will call the number listed on your records the next business day following your procedure to check on you and address any questions or concerns that you may have regarding the information given to you following your procedure. If we do not reach you, we will leave a message.  However, if you are feeling well and you are not experiencing any problems, there is no need to return our call.  We will assume that you have returned to your regular daily activities without incident.  If any biopsies were taken you will be contacted by phone or by letter within the next 1-3 weeks.  Please call us at [phone] if you have not heard about the biopsies in 3 weeks.    SIGNATURES/CONFIDENTIALITY: You and/or your care partner have signed paperwork which will be entered into your electronic medical record.  These signatures attest to the fact that that the information above on your After Visit Summary has been reviewed and is understood.  Full responsibility of the confidentiality of this discharge information lies with you and/or your care-partner.  Read all of the handouts given to you by your recovery  room nurse.  Thank-you for choosing us for your healthcare needs today. 

## 2016-04-13 NOTE — Op Note (Signed)
[facility] Patient Name: Rebecca Humphrey Procedure Date: 04/13/2016 8:42 AM MRN: [id number] Endoscopist: Mallie Mussel L. Loletha Carrow , MD Age: 50 Referring MD:  Date of Birth: [birth year] Gender: Female Account #: [id number] Procedure:                Colonoscopy Indications:              Screening for colorectal malignant neoplasm, This                            is the patient's first colonoscopy Medicines:                Monitored Anesthesia Care Procedure:                Pre-Anesthesia Assessment:                           - Prior to the procedure, a History and Physical                            was performed, and patient medications and                            allergies were reviewed. The patient's tolerance of                            previous anesthesia was also reviewed. The risks                            and benefits of the procedure and the sedation                            options and risks were discussed with the patient.                            All questions were answered, and informed consent                            was obtained. Prior Anticoagulants: The patient has                            taken no previous anticoagulant or antiplatelet                            agents. ASA Grade Assessment: II - A patient with                            mild systemic disease. After reviewing the risks                            and benefits, the patient was deemed in                            satisfactory condition to undergo the procedure.  After obtaining informed consent, the colonoscope                            was passed under direct vision. Throughout the                            procedure, the patient's blood pressure, pulse, and                            oxygen saturations were monitored continuously. The                            Model PCF-H190DL [id number]) scope was introduced                            through the anus and  advanced to the the cecum,                            identified by appendiceal orifice and ileocecal                            valve. The colonoscopy was performed without                            difficulty. The patient tolerated the procedure                            well. The quality of the bowel preparation was                            good. The ileocecal valve, appendiceal orifice, and                            rectum were photographed. The bowel preparation                            used was Miralax. Scope In: 8:52:51 AM Scope Out: 9:09:28 AM Scope Withdrawal Time: 0 hours 13 minutes 34 seconds  Total Procedure Duration: 0 hours 16 minutes 37 seconds  Findings:                 The perianal and digital rectal examinations were                            normal.                           Two sessile polyps were found in the mid descending                            colon and proximal transverse colon. The polyps                            were 2 mm in size. These polyps were removed with a  piecemeal technique using a cold biopsy forceps.                            Resection and retrieval were complete.                           Multiple medium-mouthed diverticula were found in                            the right colon.                           The exam was otherwise without abnormality on                            direct and retroflexion views. Complications:            No immediate complications. Estimated Blood Loss:     Estimated blood loss: none. Impression:               - Two 2 mm polyps in the mid descending colon and                            in the proximal transverse colon, removed piecemeal                            using a cold biopsy forceps. Resected and retrieved.                           - Diverticulosis in the right colon.                           - The examination was otherwise normal on direct                             and retroflexion views. Recommendation:           - Patient has a contact number available for                            emergencies. The signs and symptoms of potential                            delayed complications were discussed with the                            patient. Return to normal activities tomorrow.                            Written discharge instructions were provided to the                            patient.                           - Resume previous diet.                           -  Continue present medications.                           - Await pathology results.                           - Repeat colonoscopy is recommended for                            surveillance. The colonoscopy date will be                            determined after pathology results from today's                            exam become available for review. Cornell Gaber L. Loletha Carrow, MD 04/13/2016 9:13:47 AM This report has been signed electronically.

## 2016-04-13 NOTE — Progress Notes (Signed)
Called to room to assist during endoscopic procedure.  Patient ID and intended procedure confirmed with present staff. Received instructions for my participation in the procedure from the performing physician.  

## 2016-04-13 NOTE — Progress Notes (Signed)
Report to PACU, RN, vss, BBS= Clear.  

## 2016-04-16 ENCOUNTER — Telehealth: Payer: Self-pay | Admitting: *Deleted

## 2016-04-16 NOTE — Telephone Encounter (Signed)
No answer, left message to call if questions or concerns. 

## 2016-04-19 ENCOUNTER — Encounter: Payer: Self-pay | Admitting: Gastroenterology

## 2018-02-13 DIAGNOSIS — E7849 Other hyperlipidemia: Secondary | ICD-10-CM | POA: Diagnosis not present

## 2018-02-13 DIAGNOSIS — I10 Essential (primary) hypertension: Secondary | ICD-10-CM | POA: Diagnosis not present

## 2018-02-13 DIAGNOSIS — R7303 Prediabetes: Secondary | ICD-10-CM | POA: Diagnosis not present

## 2018-02-13 DIAGNOSIS — Z0001 Encounter for general adult medical examination with abnormal findings: Secondary | ICD-10-CM | POA: Diagnosis not present

## 2018-02-13 DIAGNOSIS — Z0000 Encounter for general adult medical examination without abnormal findings: Secondary | ICD-10-CM | POA: Diagnosis not present

## 2018-04-08 DIAGNOSIS — H8113 Benign paroxysmal vertigo, bilateral: Secondary | ICD-10-CM | POA: Diagnosis not present

## 2018-04-08 DIAGNOSIS — I10 Essential (primary) hypertension: Secondary | ICD-10-CM | POA: Diagnosis not present

## 2018-04-08 DIAGNOSIS — Z418 Encounter for other procedures for purposes other than remedying health state: Secondary | ICD-10-CM | POA: Diagnosis not present

## 2018-04-08 DIAGNOSIS — R7303 Prediabetes: Secondary | ICD-10-CM | POA: Diagnosis not present

## 2018-08-19 DIAGNOSIS — E669 Obesity, unspecified: Secondary | ICD-10-CM | POA: Diagnosis not present

## 2018-08-19 DIAGNOSIS — I10 Essential (primary) hypertension: Secondary | ICD-10-CM | POA: Diagnosis not present

## 2018-08-19 DIAGNOSIS — R7303 Prediabetes: Secondary | ICD-10-CM | POA: Diagnosis not present

## 2018-08-19 DIAGNOSIS — E7849 Other hyperlipidemia: Secondary | ICD-10-CM | POA: Diagnosis not present

## 2018-08-20 ENCOUNTER — Other Ambulatory Visit: Payer: Self-pay | Admitting: Internal Medicine

## 2018-08-20 DIAGNOSIS — E2839 Other primary ovarian failure: Secondary | ICD-10-CM

## 2018-08-22 DIAGNOSIS — Z6836 Body mass index (BMI) 36.0-36.9, adult: Secondary | ICD-10-CM | POA: Diagnosis not present

## 2018-08-22 DIAGNOSIS — Z01419 Encounter for gynecological examination (general) (routine) without abnormal findings: Secondary | ICD-10-CM | POA: Diagnosis not present

## 2018-08-22 DIAGNOSIS — Z124 Encounter for screening for malignant neoplasm of cervix: Secondary | ICD-10-CM | POA: Diagnosis not present

## 2018-08-22 DIAGNOSIS — Z1231 Encounter for screening mammogram for malignant neoplasm of breast: Secondary | ICD-10-CM | POA: Diagnosis not present

## 2018-10-01 ENCOUNTER — Ambulatory Visit
Admission: RE | Admit: 2018-10-01 | Discharge: 2018-10-01 | Disposition: A | Discharge status: Home / Self Care | Payer: BLUE CROSS/BLUE SHIELD | Source: Ambulatory Visit | Attending: Internal Medicine | Admitting: Internal Medicine

## 2018-10-01 DIAGNOSIS — Z780 Asymptomatic menopausal state: Secondary | ICD-10-CM | POA: Diagnosis not present

## 2018-10-01 DIAGNOSIS — M8588 Other specified disorders of bone density and structure, other site: Secondary | ICD-10-CM | POA: Diagnosis not present

## 2018-10-01 DIAGNOSIS — E2839 Other primary ovarian failure: Secondary | ICD-10-CM

## 2019-02-17 DIAGNOSIS — E7849 Other hyperlipidemia: Secondary | ICD-10-CM | POA: Diagnosis not present

## 2019-02-17 DIAGNOSIS — R7303 Prediabetes: Secondary | ICD-10-CM | POA: Diagnosis not present

## 2019-02-17 DIAGNOSIS — Z0000 Encounter for general adult medical examination without abnormal findings: Secondary | ICD-10-CM | POA: Diagnosis not present

## 2019-02-17 DIAGNOSIS — I10 Essential (primary) hypertension: Secondary | ICD-10-CM | POA: Diagnosis not present

## 2019-02-19 DIAGNOSIS — E7849 Other hyperlipidemia: Secondary | ICD-10-CM | POA: Diagnosis not present

## 2019-02-19 DIAGNOSIS — Z0000 Encounter for general adult medical examination without abnormal findings: Secondary | ICD-10-CM | POA: Diagnosis not present

## 2019-05-19 DIAGNOSIS — K219 Gastro-esophageal reflux disease without esophagitis: Secondary | ICD-10-CM | POA: Diagnosis not present

## 2019-05-19 DIAGNOSIS — I10 Essential (primary) hypertension: Secondary | ICD-10-CM | POA: Diagnosis not present

## 2019-05-19 DIAGNOSIS — E7849 Other hyperlipidemia: Secondary | ICD-10-CM | POA: Diagnosis not present

## 2019-05-19 DIAGNOSIS — H8113 Benign paroxysmal vertigo, bilateral: Secondary | ICD-10-CM | POA: Diagnosis not present

## 2019-08-18 DIAGNOSIS — M545 Low back pain: Secondary | ICD-10-CM | POA: Diagnosis not present

## 2019-08-18 DIAGNOSIS — I10 Essential (primary) hypertension: Secondary | ICD-10-CM | POA: Diagnosis not present

## 2019-08-18 DIAGNOSIS — E1165 Type 2 diabetes mellitus with hyperglycemia: Secondary | ICD-10-CM | POA: Diagnosis not present

## 2019-08-18 DIAGNOSIS — M179 Osteoarthritis of knee, unspecified: Secondary | ICD-10-CM | POA: Diagnosis not present

## 2019-10-05 DIAGNOSIS — Z1231 Encounter for screening mammogram for malignant neoplasm of breast: Secondary | ICD-10-CM | POA: Diagnosis not present

## 2019-10-05 DIAGNOSIS — N951 Menopausal and female climacteric states: Secondary | ICD-10-CM | POA: Diagnosis not present

## 2019-10-05 DIAGNOSIS — Z1213 Encounter for screening for malignant neoplasm of small intestine: Secondary | ICD-10-CM | POA: Diagnosis not present

## 2019-10-05 DIAGNOSIS — Z01419 Encounter for gynecological examination (general) (routine) without abnormal findings: Secondary | ICD-10-CM | POA: Diagnosis not present

## 2019-10-05 DIAGNOSIS — Z124 Encounter for screening for malignant neoplasm of cervix: Secondary | ICD-10-CM | POA: Diagnosis not present

## 2019-10-05 DIAGNOSIS — Z6836 Body mass index (BMI) 36.0-36.9, adult: Secondary | ICD-10-CM | POA: Diagnosis not present

## 2019-11-19 DIAGNOSIS — E1165 Type 2 diabetes mellitus with hyperglycemia: Secondary | ICD-10-CM | POA: Diagnosis not present

## 2019-11-19 DIAGNOSIS — M179 Osteoarthritis of knee, unspecified: Secondary | ICD-10-CM | POA: Diagnosis not present

## 2019-11-19 DIAGNOSIS — E7849 Other hyperlipidemia: Secondary | ICD-10-CM | POA: Diagnosis not present

## 2019-11-19 DIAGNOSIS — I10 Essential (primary) hypertension: Secondary | ICD-10-CM | POA: Diagnosis not present

## 2020-02-18 DIAGNOSIS — E1165 Type 2 diabetes mellitus with hyperglycemia: Secondary | ICD-10-CM | POA: Diagnosis not present

## 2020-02-18 DIAGNOSIS — E7849 Other hyperlipidemia: Secondary | ICD-10-CM | POA: Diagnosis not present

## 2020-02-18 DIAGNOSIS — I10 Essential (primary) hypertension: Secondary | ICD-10-CM | POA: Diagnosis not present

## 2020-02-18 DIAGNOSIS — E559 Vitamin D deficiency, unspecified: Secondary | ICD-10-CM | POA: Diagnosis not present

## 2020-02-18 DIAGNOSIS — M179 Osteoarthritis of knee, unspecified: Secondary | ICD-10-CM | POA: Diagnosis not present

## 2020-02-18 DIAGNOSIS — Z0000 Encounter for general adult medical examination without abnormal findings: Secondary | ICD-10-CM | POA: Diagnosis not present

## 2020-03-04 DIAGNOSIS — M25561 Pain in right knee: Secondary | ICD-10-CM | POA: Diagnosis not present

## 2020-04-08 DIAGNOSIS — M25561 Pain in right knee: Secondary | ICD-10-CM | POA: Diagnosis not present

## 2020-05-13 DIAGNOSIS — M25561 Pain in right knee: Secondary | ICD-10-CM | POA: Diagnosis not present

## 2020-05-19 DIAGNOSIS — E1165 Type 2 diabetes mellitus with hyperglycemia: Secondary | ICD-10-CM | POA: Diagnosis not present

## 2020-05-19 DIAGNOSIS — I10 Essential (primary) hypertension: Secondary | ICD-10-CM | POA: Diagnosis not present

## 2020-05-19 DIAGNOSIS — M179 Osteoarthritis of knee, unspecified: Secondary | ICD-10-CM | POA: Diagnosis not present

## 2020-05-19 DIAGNOSIS — K219 Gastro-esophageal reflux disease without esophagitis: Secondary | ICD-10-CM | POA: Diagnosis not present

## 2020-05-23 DIAGNOSIS — M25561 Pain in right knee: Secondary | ICD-10-CM | POA: Diagnosis not present

## 2020-05-25 DIAGNOSIS — M25561 Pain in right knee: Secondary | ICD-10-CM | POA: Diagnosis not present

## 2020-06-14 DIAGNOSIS — L668 Other cicatricial alopecia: Secondary | ICD-10-CM | POA: Diagnosis not present

## 2020-07-07 DIAGNOSIS — N3080 Other cystitis without hematuria: Secondary | ICD-10-CM | POA: Diagnosis not present

## 2020-07-07 DIAGNOSIS — E7849 Other hyperlipidemia: Secondary | ICD-10-CM | POA: Diagnosis not present

## 2020-07-07 DIAGNOSIS — E1165 Type 2 diabetes mellitus with hyperglycemia: Secondary | ICD-10-CM | POA: Diagnosis not present

## 2020-07-07 DIAGNOSIS — I10 Essential (primary) hypertension: Secondary | ICD-10-CM | POA: Diagnosis not present

## 2020-08-11 DIAGNOSIS — M179 Osteoarthritis of knee, unspecified: Secondary | ICD-10-CM | POA: Diagnosis not present

## 2020-08-11 DIAGNOSIS — E1165 Type 2 diabetes mellitus with hyperglycemia: Secondary | ICD-10-CM | POA: Diagnosis not present

## 2020-08-11 DIAGNOSIS — I10 Essential (primary) hypertension: Secondary | ICD-10-CM | POA: Diagnosis not present

## 2020-08-11 DIAGNOSIS — E7849 Other hyperlipidemia: Secondary | ICD-10-CM | POA: Diagnosis not present

## 2020-08-15 DIAGNOSIS — H469 Unspecified optic neuritis: Secondary | ICD-10-CM | POA: Diagnosis not present

## 2020-08-19 ENCOUNTER — Other Ambulatory Visit: Payer: Self-pay

## 2020-08-19 ENCOUNTER — Emergency Department (HOSPITAL_COMMUNITY): Payer: BC Managed Care – PPO

## 2020-08-19 ENCOUNTER — Emergency Department (HOSPITAL_COMMUNITY)
Admission: EM | Admit: 2020-08-19 | Discharge: 2020-08-19 | Disposition: A | Discharge status: Home / Self Care | Payer: BC Managed Care – PPO | Attending: Emergency Medicine | Admitting: Emergency Medicine

## 2020-08-19 DIAGNOSIS — E229 Hyperfunction of pituitary gland, unspecified: Secondary | ICD-10-CM | POA: Insufficient documentation

## 2020-08-19 DIAGNOSIS — D352 Benign neoplasm of pituitary gland: Secondary | ICD-10-CM | POA: Diagnosis not present

## 2020-08-19 DIAGNOSIS — Z7982 Long term (current) use of aspirin: Secondary | ICD-10-CM | POA: Diagnosis not present

## 2020-08-19 DIAGNOSIS — I10 Essential (primary) hypertension: Secondary | ICD-10-CM | POA: Diagnosis not present

## 2020-08-19 DIAGNOSIS — H547 Unspecified visual loss: Secondary | ICD-10-CM | POA: Diagnosis not present

## 2020-08-19 DIAGNOSIS — R29818 Other symptoms and signs involving the nervous system: Secondary | ICD-10-CM | POA: Diagnosis not present

## 2020-08-19 DIAGNOSIS — Z79899 Other long term (current) drug therapy: Secondary | ICD-10-CM | POA: Insufficient documentation

## 2020-08-19 LAB — CBC
HCT: 42.6 % (ref 36.0–46.0)
Hemoglobin: 13.5 g/dL (ref 12.0–15.0)
MCH: 27.2 pg (ref 26.0–34.0)
MCHC: 31.7 g/dL (ref 30.0–36.0)
MCV: 85.9 fL (ref 80.0–100.0)
Platelets: 328 10*3/uL (ref 150–400)
RBC: 4.96 MIL/uL (ref 3.87–5.11)
RDW: 14.4 % (ref 11.5–15.5)
WBC: 5.7 10*3/uL (ref 4.0–10.5)
nRBC: 0 % (ref 0.0–0.2)

## 2020-08-19 LAB — BASIC METABOLIC PANEL
Anion gap: 11 (ref 5–15)
BUN: 14 mg/dL (ref 6–20)
CO2: 25 mmol/L (ref 22–32)
Calcium: 9.6 mg/dL (ref 8.9–10.3)
Chloride: 103 mmol/L (ref 98–111)
Creatinine, Ser: 0.81 mg/dL (ref 0.44–1.00)
GFR, Estimated: >60 mL/min (ref >60)
Glucose, Bld: 117 mg/dL — ABNORMAL HIGH (ref 70–99)
Potassium: 3.2 mmol/L — ABNORMAL LOW (ref 3.5–5.1)
Sodium: 139 mmol/L (ref 135–145)

## 2020-08-19 MED ORDER — GADOBUTROL 1 MMOL/ML IV SOLN
9.0000 mL | Freq: Once | INTRAVENOUS | Status: AC | PRN
  Administered 2020-08-19: 9 mL via INTRAVENOUS

## 2020-08-19 NOTE — Discharge Instructions (Addendum)
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  It is what is causing your vision change.  Follow-up with neurosurgery on Tuesday.  Call on Monday at 9 AM for an appointment.

## 2020-08-19 NOTE — ED Provider Notes (Signed)
[facility] Provider Note   CSN: [id number] Arrival date & time: 08/19/20  1229     History Chief Complaint  Patient presents with   Loss of Vision    Rebecca Humphrey is a 54 y.o. female.  HPI Patient presents from ophthalmology for vision changes.  Reportedly has loss of her left lateral visual field.  Reports been going for last couple months.  Had loss of retinal nerve fiber layer and left visual field deficit.  Sent in to rule out optic neuritis versus compression.  No headaches.  No confusion.  Has not had episode like this before.  Has not had other episodes of numbness or weakness.  No floaters.              Past Medical History:  Diagnosis Date   Asherman's syndrome    fibroid disease - no surgery with this per pt.   Carotid stenosis, asymptomatic    Bilateral ICA 1-39%  and mild ECA stenosis  per duplex 05-21-2014   GERD (gastroesophageal reflux disease)    Heart murmur    History of ectopic pregnancy    Treated with methotrexated in 2007 right side   Hyperlipidemia    Hypertension    Infertility, female    Submucous myoma of uterus    Wears glasses     Patient Active Problem List   Diagnosis Date Noted   Fibroids 07/23/2014    Past Surgical History:  Procedure Laterality Date   CESAREAN SECTION  09-29-2007   TRANSTHORACIC ECHOCARDIOGRAM  05-21-2014   grade I diastolic dysfunction/  ef 60-65%/  mild AR and  MR/  mild LAE     OB History    Gravida  4   Para  1   Term  1   Preterm      AB  3   Living  1     SAB  2   TAB      Ectopic  1   Multiple      Live Births  1           Family History  Problem Relation Age of Onset   Diabetes Mother    Heart disease Father    Hypertension Father    Colon cancer Neg Hx    Colon polyps Neg Hx    Stomach cancer Neg Hx    Rectal cancer Neg Hx     Social History   Tobacco Use   Smoking status: Never Smoker    Smokeless tobacco: Never Used  Substance Use Topics   Alcohol use: No    Alcohol/week: 0.0 standard drinks   Drug use: No    Home Medications Prior to Admission medications   Medication Sig Start Date End Date Taking? Authorizing Provider  acetaminophen (TYLENOL) 500 MG tablet Take 500 mg by mouth every 8 (eight) hours as needed.    [provider]  aspirin 81 MG tablet Take 81 mg by mouth daily.    [provider]  Cholecalciferol (VITAMIN D3) 1000 UNITS CAPS Take 1 capsule by mouth daily. Reported on 04/03/2016    [provider]  ferrous sulfate 325 (65 FE) MG tablet Take 325 mg by mouth daily with breakfast.    [provider]  hydrochlorothiazide (HYDRODIURIL) 25 MG tablet Take 25 mg by mouth every morning.     [provider]  omeprazole (PRILOSEC) 20 MG capsule Take 20 mg by mouth every morning.  [provider]  potassium chloride (K-DUR) 10 MEQ tablet Take 10 mEq by mouth every morning.  05/14/14   [provider]  simvastatin (ZOCOR) 20 MG tablet Take 20 mg by mouth daily at 6 PM.    [provider]    Allergies    Patient has no known allergies.  Review of Systems   Review of Systems  Constitutional: Negative for appetite change.  HENT: Negative for congestion.   Eyes: Positive for visual disturbance.  Respiratory: Negative for shortness of breath.   Cardiovascular: Negative for chest pain.  Gastrointestinal: Negative for abdominal pain.  Genitourinary: Negative for flank pain.  Musculoskeletal: Negative for back pain.  Skin: Negative for rash.  Neurological: Negative for weakness.  Psychiatric/Behavioral: Negative for confusion.    Physical Exam Updated Vital Signs BP 137/79 (BP Location: Right Arm)    Pulse 61    Temp 98.4 F (36.9 C) (Oral)    Resp 18    Ht 5\' 3"  (1.6 m)    Wt 90.7 kg    LMP 06/28/2015    SpO2 99%    BMI 35.43 kg/m   Physical Exam Vitals and nursing note reviewed.    HENT:     Head: Atraumatic.     Mouth/Throat:     Mouth: Mucous membranes are moist.  Eyes:     Extraocular Movements: Extraocular movements intact.     Pupils: Pupils are equal, round, and reactive to light.     Comments: Visual fields grossly intact to confrontation for me.  Pupils reactive.  Eye movements intact.  Cardiovascular:     Rate and Rhythm: Regular rhythm.  Pulmonary:     Breath sounds: No wheezing.  Abdominal:     Tenderness: There is no abdominal tenderness.  Musculoskeletal:        General: No tenderness.     Cervical back: Neck supple.  Skin:    General: Skin is warm.     Capillary Refill: Capillary refill takes less than 2 seconds.  Neurological:     Mental Status: She is alert and oriented to person, place, and time.  Psychiatric:        Mood and Affect: Mood normal.     ED Results / Procedures / Treatments   Labs (all labs ordered are listed, but only abnormal results are displayed) Labs Reviewed  BASIC METABOLIC PANEL - Abnormal; Notable for the following components:      Result Value   Potassium 3.2 (*)    Glucose, Bld 117 (*)    All other components within normal limits  CBC    EKG None  Radiology MR ANGIO HEAD WO CONTRAST  Addendum Date: 08/19/2020   ADDENDUM REPORT: 08/19/2020 20:07 ADDENDUM: These results were called by telephone at the time of interpretation on 08/19/2020 at 7:55 Pm to provider Davonna Belling , who verbally acknowledged these results. Electronically Signed   By: Primitivo Gauze M.D.   On: 08/19/2020 20:07   Result Date: 08/19/2020 CLINICAL DATA:  Neuro deficit, acute stroke suspected. Peripheral vision loss. EXAM: MRI HEAD WITHOUT AND WITH CONTRAST MRA HEAD WITHOUT CONTRAST TECHNIQUE: Multiplanar, multiecho pulse sequences of the brain and surrounding structures were obtained without and with intravenous contrast. Angiographic images of the head were obtained using MRA technique without contrast. CONTRAST:  22mL  GADAVIST GADOBUTROL 1 MMOL/ML IV SOLN COMPARISON:  Concurrent MRI orbits. FINDINGS: MRI HEAD FINDINGS Brain: No diffusion-weighted signal abnormality. No midline shift, ventriculomegaly or extra-axial fluid collection. Cerebral volume is  within normal limits. Minimal chronic microvascular ischemic changes. There is an enhancing pituitary mass demonstrating suprasellar extension measuring 2.7 x 1.8 x 1.8 cm (19:11, 17:20). Trace intrinsically T1 hyperintense signal along the periphery of the mass. The left cavernous sinus is involved with partial encasement of the left cavernous ICA. Likely minimal extension of soft tissue into the right cavernous sinus. There is superior displacement of the optic chiasm with splaying of the prechiasmatic optic nerves. Vascular: Please see MRA head. Skull and upper cervical spine: Normal marrow signal. Sinuses/Orbits: Please see dedicated MRI orbits. Other: None. MRA HEAD FINDINGS Anterior circulation: Patent ICAs. No significant luminal narrowing involving the cavernous segments. Patent anterior and middle cerebral arteries. Mild anterior displacement of the bilateral A1 segments secondary to mass effect. No significant stenosis, proximal occlusion, aneurysm, or vascular malformation. Posterior circulation: Dominant right vertebral artery. The left vertebral artery terminates as PICA. Patent basilar, superior cerebellar and posterior cerebral arteries. Fetal origin of the bilateral PCAs. No significant stenosis, proximal occlusion, aneurysm, or vascular malformation. Venous sinuses: No evidence of thrombosis. Anatomic variants: Discussed above. 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 Superior displacement of the optic chiasm with splaying of the prechiasmatic optic nerves. MRA head: No large vessel occlusion or high-grade narrowing. Mild anterior displacement of the A1  segments secondary to mass effect. Please see concurrent MRI orbits for additional findings. Electronically Signed: By: Primitivo Gauze M.D. On: 08/19/2020 19:52   MR Brain W and Wo Contrast  Addendum Date: 08/19/2020   ADDENDUM REPORT: 08/19/2020 20:07 ADDENDUM: These results were called by telephone at the time of interpretation on 08/19/2020 at 7:55 Pm to provider Davonna Belling , who verbally acknowledged these results. Electronically Signed   By: Primitivo Gauze M.D.   On: 08/19/2020 20:07   Result Date: 08/19/2020 CLINICAL DATA:  Neuro deficit, acute stroke suspected. Peripheral vision loss. EXAM: MRI HEAD WITHOUT AND WITH CONTRAST MRA HEAD WITHOUT CONTRAST TECHNIQUE: Multiplanar, multiecho pulse sequences of the brain and surrounding structures were obtained without and with intravenous contrast. Angiographic images of the head were obtained using MRA technique without contrast. CONTRAST:  4mL GADAVIST GADOBUTROL 1 MMOL/ML IV SOLN COMPARISON:  Concurrent MRI orbits. FINDINGS: MRI HEAD FINDINGS Brain: No diffusion-weighted signal abnormality. No midline shift, ventriculomegaly or extra-axial fluid collection. Cerebral volume is within normal limits. Minimal chronic microvascular ischemic changes. There is an enhancing pituitary mass demonstrating suprasellar extension measuring 2.7 x 1.8 x 1.8 cm (19:11, 17:20). Trace intrinsically T1 hyperintense signal along the periphery of the mass. The left cavernous sinus is involved with partial encasement of the left cavernous ICA. Likely minimal extension of soft tissue into the right cavernous sinus. There is superior displacement of the optic chiasm with splaying of the prechiasmatic optic nerves. Vascular: Please see MRA head. Skull and upper cervical spine: Normal marrow signal. Sinuses/Orbits: Please see dedicated MRI orbits. Other: None. MRA HEAD FINDINGS Anterior circulation: Patent ICAs. No significant luminal narrowing involving the  cavernous segments. Patent anterior and middle cerebral arteries. Mild anterior displacement of the bilateral A1 segments secondary to mass effect. No significant stenosis, proximal occlusion, aneurysm, or vascular malformation. Posterior circulation: Dominant right vertebral artery. The left vertebral artery terminates as PICA. Patent basilar, superior cerebellar and posterior cerebral arteries. Fetal origin of the bilateral PCAs. No significant stenosis, proximal occlusion, aneurysm, or vascular malformation. Venous sinuses: No evidence of thrombosis. Anatomic variants: Discussed above. 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 Superior displacement of the optic chiasm with splaying of the prechiasmatic optic nerves. MRA head: No large vessel occlusion or high-grade narrowing. Mild anterior displacement of the A1 segments secondary to mass effect. Please see concurrent MRI orbits for additional findings. Electronically Signed: By: Primitivo Gauze M.D. On: 08/19/2020 19:52   MR ORBITS W WO CONTRAST  Result Date: 08/19/2020 CLINICAL DATA:  Peripheral vision loss. EXAM: MRI OF THE ORBITS WITHOUT AND WITH CONTRAST TECHNIQUE: Multiplanar, multisequence MR imaging of the orbits was performed both before and after the administration of intravenous contrast. CONTRAST:  71mL GADAVIST GADOBUTROL 1 MMOL/ML IV SOLN COMPARISON:  Concurrent MRI/MRA head. FINDINGS: Please see concurrent MRI head for intracranial findings and further evaluation of sellar/suprasellar mass. The globes are intact. Normal appearance of the intraorbital optic nerves and extraocular muscles. No abnormal enhancement or fat stranding involving the orbital fat. External soft tissues are unremarkable. Clear paranasal sinuses.  No mastoid effusion. IMPRESSION: Normal MRI appearance of the orbits. Sellar/suprasellar mass with superior  displacement of the optic chiasm and splaying of the prechiasmatic optic nerves, better evaluated on concurrent MRI head. Electronically Signed   By: Primitivo Gauze M.D.   On: 08/19/2020 20:05    Procedures Procedures (including critical care time)  Medications Ordered in ED Medications  gadobutrol (GADAVIST) 1 MMOL/ML injection 9 mL (9 mLs Intravenous Contrast Given 08/19/20 1903)    ED Course  I have reviewed the triage vital signs and the nursing notes.  Pertinent labs & imaging results that were available during my care of the patient were reviewed by me and considered in my medical decision making (see chart for details).    MDM Rules/Calculators/A&P                          Patient presents with vision change.  Seen by ophthalmology and has left lateral visual field cut.  MRI done and showed pituitary macroadenoma.  Discussed with neurosurgery.  Can see Dr. Saintclair Halsted on Tuesday.  Call Monday for an appointment.  Does not appear to need follow-up more urgently than that.  Discussed with patient.  Discharge home.  MRI imaging reviewed by me. 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

## 2020-08-19 NOTE — ED Triage Notes (Signed)
Pt sent to ED by eye doctor to get MRI to r/o reason for Periferal vision loss

## 2020-08-25 DIAGNOSIS — I10 Essential (primary) hypertension: Secondary | ICD-10-CM | POA: Diagnosis not present

## 2020-08-25 DIAGNOSIS — Z6837 Body mass index (BMI) 37.0-37.9, adult: Secondary | ICD-10-CM | POA: Diagnosis not present

## 2020-08-25 DIAGNOSIS — D352 Benign neoplasm of pituitary gland: Secondary | ICD-10-CM | POA: Diagnosis not present

## 2020-08-26 DIAGNOSIS — D352 Benign neoplasm of pituitary gland: Secondary | ICD-10-CM | POA: Diagnosis not present

## 2020-09-09 DIAGNOSIS — R030 Elevated blood-pressure reading, without diagnosis of hypertension: Secondary | ICD-10-CM | POA: Diagnosis not present

## 2020-09-09 DIAGNOSIS — D352 Benign neoplasm of pituitary gland: Secondary | ICD-10-CM | POA: Diagnosis not present

## 2020-09-09 DIAGNOSIS — Z6836 Body mass index (BMI) 36.0-36.9, adult: Secondary | ICD-10-CM | POA: Diagnosis not present

## 2020-09-23 DIAGNOSIS — D225 Melanocytic nevi of trunk: Secondary | ICD-10-CM | POA: Diagnosis not present

## 2020-09-23 DIAGNOSIS — L668 Other cicatricial alopecia: Secondary | ICD-10-CM | POA: Diagnosis not present

## 2020-09-28 DIAGNOSIS — H469 Unspecified optic neuritis: Secondary | ICD-10-CM | POA: Diagnosis not present

## 2020-10-27 DIAGNOSIS — Z01419 Encounter for gynecological examination (general) (routine) without abnormal findings: Secondary | ICD-10-CM | POA: Diagnosis not present

## 2020-10-27 DIAGNOSIS — Z6836 Body mass index (BMI) 36.0-36.9, adult: Secondary | ICD-10-CM | POA: Diagnosis not present

## 2020-10-27 DIAGNOSIS — Z1231 Encounter for screening mammogram for malignant neoplasm of breast: Secondary | ICD-10-CM | POA: Diagnosis not present

## 2020-11-17 DIAGNOSIS — I10 Essential (primary) hypertension: Secondary | ICD-10-CM | POA: Diagnosis not present

## 2020-11-17 DIAGNOSIS — E1165 Type 2 diabetes mellitus with hyperglycemia: Secondary | ICD-10-CM | POA: Diagnosis not present

## 2020-11-17 DIAGNOSIS — C751 Malignant neoplasm of pituitary gland: Secondary | ICD-10-CM | POA: Diagnosis not present

## 2020-11-17 DIAGNOSIS — E7849 Other hyperlipidemia: Secondary | ICD-10-CM | POA: Diagnosis not present

## 2020-12-22 DIAGNOSIS — C751 Malignant neoplasm of pituitary gland: Secondary | ICD-10-CM | POA: Diagnosis not present

## 2020-12-22 DIAGNOSIS — E1165 Type 2 diabetes mellitus with hyperglycemia: Secondary | ICD-10-CM | POA: Diagnosis not present

## 2020-12-22 DIAGNOSIS — I10 Essential (primary) hypertension: Secondary | ICD-10-CM | POA: Diagnosis not present

## 2021-02-14 DIAGNOSIS — E7849 Other hyperlipidemia: Secondary | ICD-10-CM | POA: Diagnosis not present

## 2021-02-14 DIAGNOSIS — E559 Vitamin D deficiency, unspecified: Secondary | ICD-10-CM | POA: Diagnosis not present

## 2021-02-14 DIAGNOSIS — E1165 Type 2 diabetes mellitus with hyperglycemia: Secondary | ICD-10-CM | POA: Diagnosis not present

## 2021-02-14 DIAGNOSIS — I10 Essential (primary) hypertension: Secondary | ICD-10-CM | POA: Diagnosis not present

## 2021-02-14 DIAGNOSIS — Z0000 Encounter for general adult medical examination without abnormal findings: Secondary | ICD-10-CM | POA: Diagnosis not present

## 2021-05-01 IMAGING — MR MR HEAD WO/W CM
5 of 16 series · 16 of 48 positions shown · IV contrast (gadavist)
Comparison: Concurrent MRI orbits.
COMPARISON: Concurrent MRI orbits.

Addendum:
CLINICAL DATA: Neuro deficit, acute stroke suspected. Peripheral
vision loss.

EXAM:
MRI HEAD WITHOUT AND WITH CONTRAST
MRA HEAD WITHOUT CONTRAST
TECHNIQUE: Multiplanar, multiecho pulse sequences of the brain and surrounding
structures were obtained without and with intravenous contrast.
Angiographic images of the head were obtained using MRA technique
without contrast.
CONTRAST:  9mL GADAVIST GADOBUTROL 1 MMOL/ML IV SOLN

[Series 3: DWI · axial · 3.0mm · 0.94mm/px · z∈[-81,+65]mm · 6 of 100 slices shown (1 of 2)]
[im 1/100]
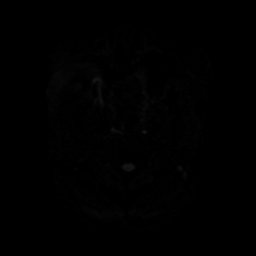
[im 20/100]
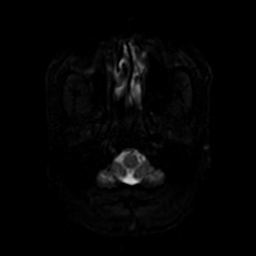
[im 40/100]
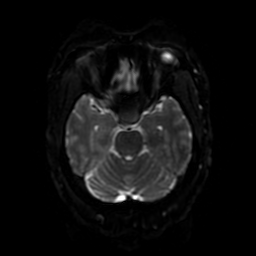
[im 60/100]
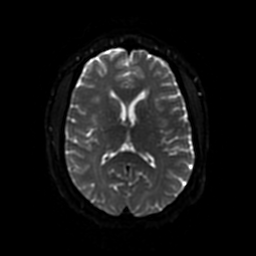
[im 80/100]
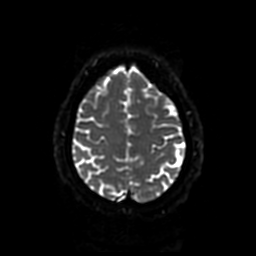
[im 100/100]
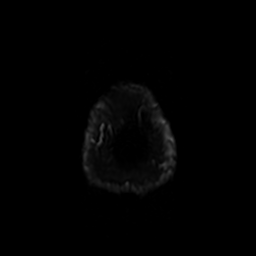

[Series 4: FLAIR · sagittal · 5.0mm · 0.23mm/px · 1 of 23 slices shown (1 of 2)]
[im 1/23]
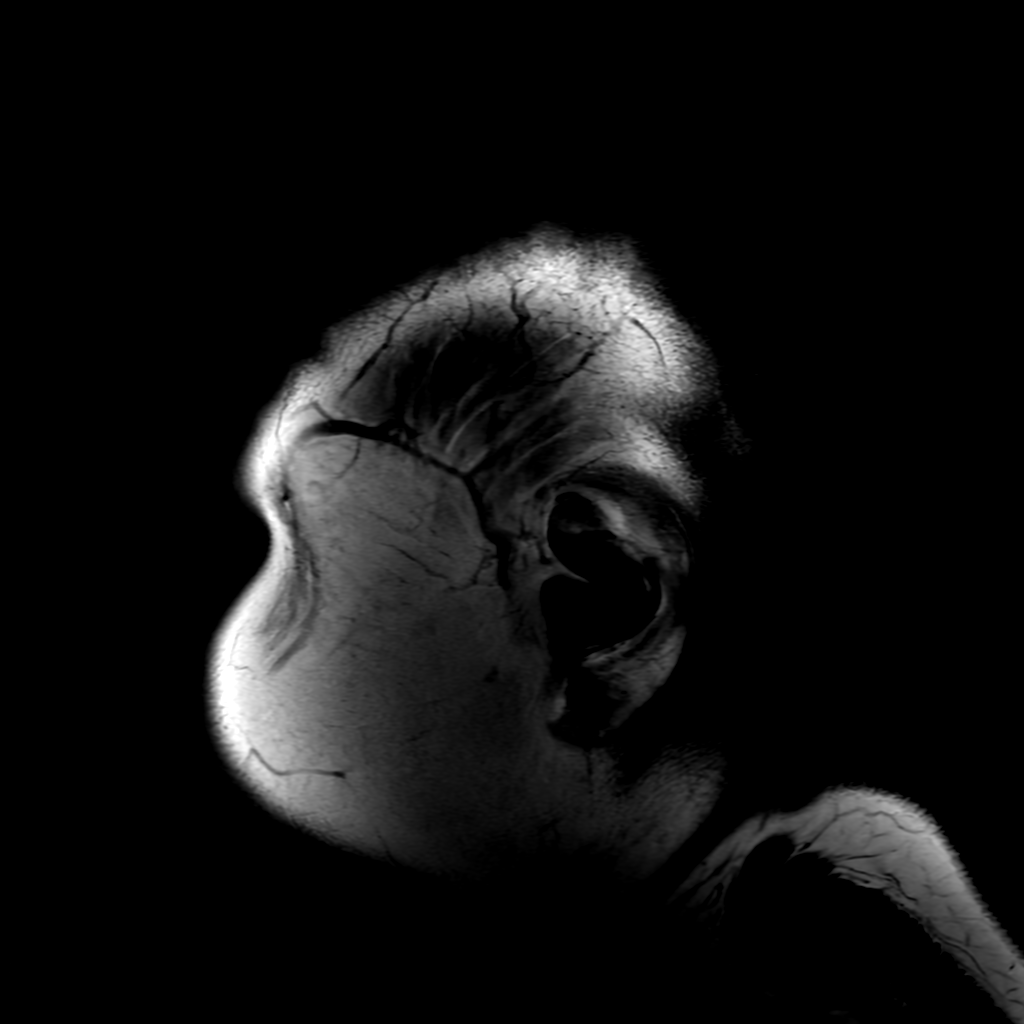

[Series 6: DWI · coronal · 4.0mm · 0.94mm/px · 5 of 74 slices shown (2 of 2)]
[im 1/74]
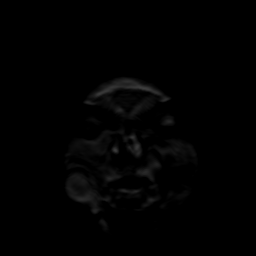
[im 19/74]
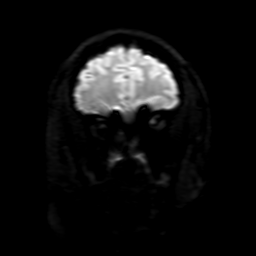
[im 37/74]
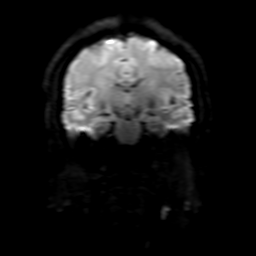
[im 55/74]
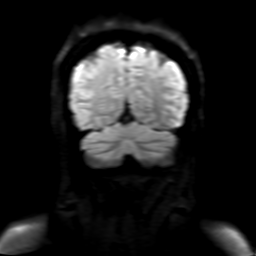
[im 74/74]
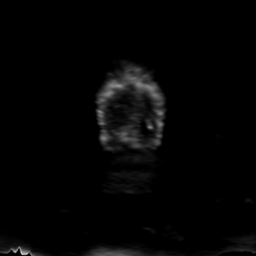

[Series 7: FLAIR · axial · 3.0mm · 0.47mm/px · z∈[-84,+66]mm · 2 of 26 slices shown (2 of 2)]
[im 1/26]
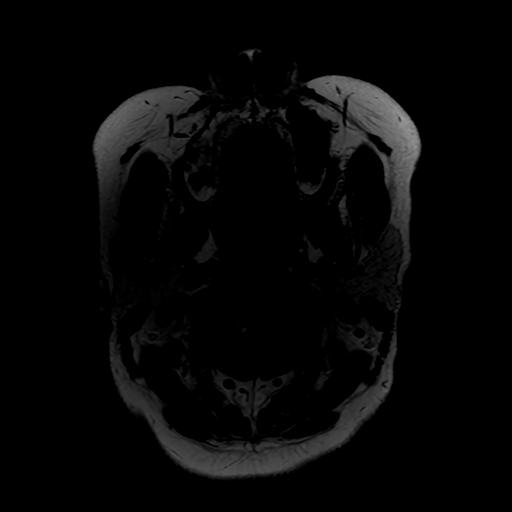
[im 26/26]
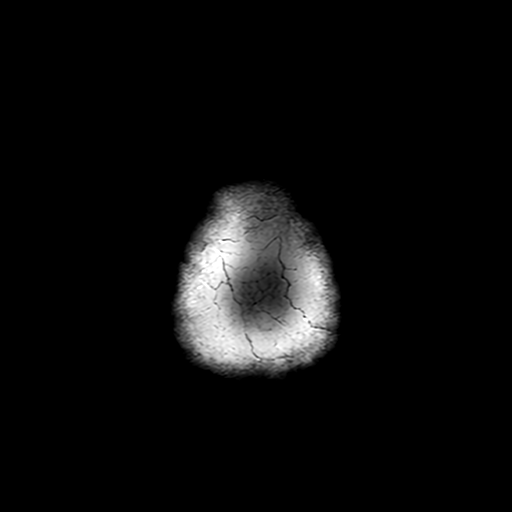

[Series 19: FLAIR post-contrast · sagittal · 5.0mm · 0.23mm/px · 2 of 23 slices shown]
[im 1/23]
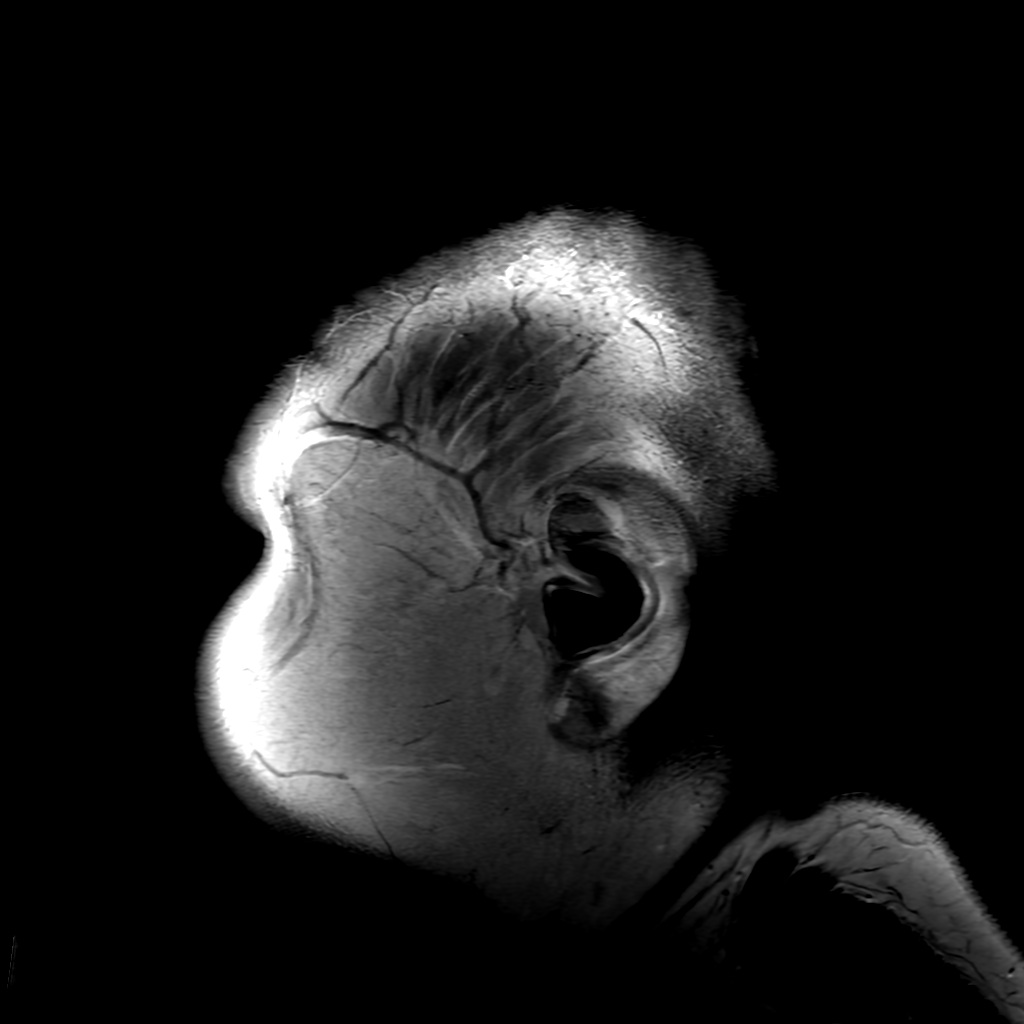
[im 23/23]
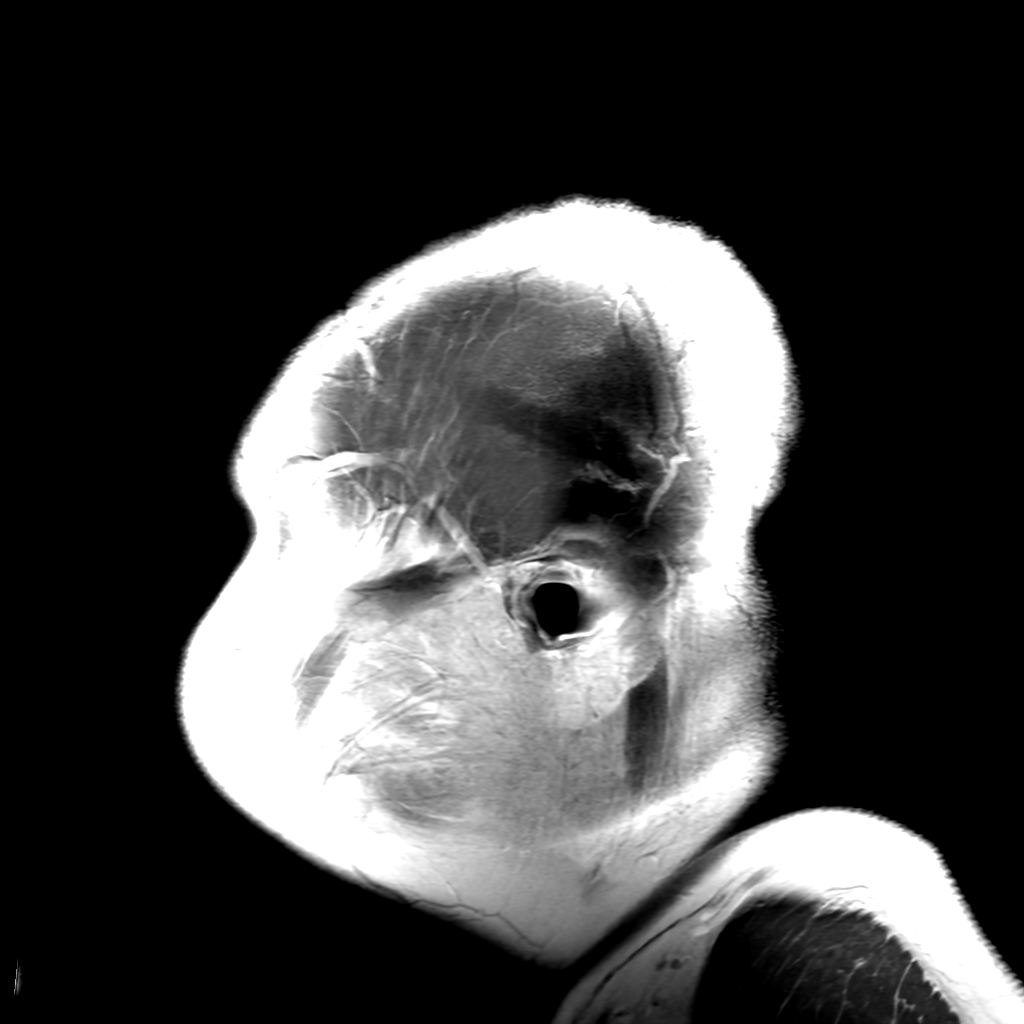

[16 of 48 positions shown; findings below may reference images not displayed]

FINDINGS: MRI HEAD FINDINGS

Brain: No diffusion-weighted signal abnormality. No midline shift,
ventriculomegaly or extra-axial fluid collection. Cerebral volume is
within normal limits. Minimal chronic microvascular ischemic
changes.

There is an enhancing pituitary mass demonstrating suprasellar
extension measuring 2.7 x 1.8 x 1.8 cm ([DATE], [DATE]). Trace
intrinsically T1 hyperintense signal along the periphery of the
mass. The left cavernous sinus is involved with partial encasement
of the left cavernous ICA. Likely minimal extension of soft tissue
into the right cavernous sinus. There is superior displacement of
the optic chiasm with splaying of the prechiasmatic optic nerves.

Vascular: Please see MRA head.

Skull and upper cervical spine: Normal marrow signal.

Sinuses/Orbits: Please see dedicated MRI orbits.

Other: None.

MRA HEAD FINDINGS

Anterior circulation: Patent ICAs. No significant luminal narrowing
involving the cavernous segments. Patent anterior and middle
cerebral arteries. Mild anterior displacement of the bilateral A1
segments secondary to mass effect. No significant stenosis, proximal
occlusion, aneurysm, or vascular malformation.

Posterior circulation: Dominant right vertebral artery. The left
vertebral artery terminates as PICA. Patent basilar, superior
cerebellar and posterior cerebral arteries. Fetal origin of the
bilateral PCAs. No significant stenosis, proximal occlusion,
aneurysm, or vascular malformation.

Venous sinuses: No evidence of thrombosis.

Anatomic variants: Discussed above.
IMPRESSION: MRI head:

Enhancing sellar/suprasellar mass involving the left greater than
right cavernous sinuses and measuring 2.7 cm, likely macroadenoma.

Superior displacement of the optic chiasm with splaying of the
prechiasmatic optic nerves.

MRA head:

No large vessel occlusion or high-grade narrowing. Mild anterior
displacement of the A1 segments secondary to mass effect.

Please see concurrent MRI orbits for additional findings.

ADDENDUM:
These results were called by telephone at the time of interpretation
on 08/19/2020 at [DATE] to provider JOFFRE DIEUDONNE , who verbally
acknowledged these results.

*** End of Addendum ***
FINDINGS: MRI HEAD FINDINGS

Brain: No diffusion-weighted signal abnormality. No midline shift,
ventriculomegaly or extra-axial fluid collection. Cerebral volume is
within normal limits. Minimal chronic microvascular ischemic
changes.

There is an enhancing pituitary mass demonstrating suprasellar
extension measuring 2.7 x 1.8 x 1.8 cm ([DATE], [DATE]). Trace
intrinsically T1 hyperintense signal along the periphery of the
mass. The left cavernous sinus is involved with partial encasement
of the left cavernous ICA. Likely minimal extension of soft tissue
into the right cavernous sinus. There is superior displacement of
the optic chiasm with splaying of the prechiasmatic optic nerves.

Vascular: Please see MRA head.

Skull and upper cervical spine: Normal marrow signal.

Sinuses/Orbits: Please see dedicated MRI orbits.

Other: None.

MRA HEAD FINDINGS

Anterior circulation: Patent ICAs. No significant luminal narrowing
involving the cavernous segments. Patent anterior and middle
cerebral arteries. Mild anterior displacement of the bilateral A1
segments secondary to mass effect. No significant stenosis, proximal
occlusion, aneurysm, or vascular malformation.

Posterior circulation: Dominant right vertebral artery. The left
vertebral artery terminates as PICA. Patent basilar, superior
cerebellar and posterior cerebral arteries. Fetal origin of the
bilateral PCAs. No significant stenosis, proximal occlusion,
aneurysm, or vascular malformation.

Venous sinuses: No evidence of thrombosis.

Anatomic variants: Discussed above.
IMPRESSION: MRI head:

Enhancing sellar/suprasellar mass involving the left greater than
right cavernous sinuses and measuring 2.7 cm, likely macroadenoma.

Superior displacement of the optic chiasm with splaying of the
prechiasmatic optic nerves.

MRA head:

No large vessel occlusion or high-grade narrowing. Mild anterior
displacement of the A1 segments secondary to mass effect.

Please see concurrent MRI orbits for additional findings.

## 2021-06-21 DIAGNOSIS — C751 Malignant neoplasm of pituitary gland: Secondary | ICD-10-CM | POA: Diagnosis not present

## 2021-06-21 DIAGNOSIS — I10 Essential (primary) hypertension: Secondary | ICD-10-CM | POA: Diagnosis not present

## 2021-06-21 DIAGNOSIS — E1165 Type 2 diabetes mellitus with hyperglycemia: Secondary | ICD-10-CM | POA: Diagnosis not present

## 2021-06-21 DIAGNOSIS — K219 Gastro-esophageal reflux disease without esophagitis: Secondary | ICD-10-CM | POA: Diagnosis not present

## 2021-07-11 DIAGNOSIS — D352 Benign neoplasm of pituitary gland: Secondary | ICD-10-CM | POA: Diagnosis not present

## 2021-07-21 DIAGNOSIS — C751 Malignant neoplasm of pituitary gland: Secondary | ICD-10-CM | POA: Diagnosis not present

## 2021-07-21 DIAGNOSIS — M179 Osteoarthritis of knee, unspecified: Secondary | ICD-10-CM | POA: Diagnosis not present

## 2021-07-21 DIAGNOSIS — I10 Essential (primary) hypertension: Secondary | ICD-10-CM | POA: Diagnosis not present

## 2021-07-21 DIAGNOSIS — E1165 Type 2 diabetes mellitus with hyperglycemia: Secondary | ICD-10-CM | POA: Diagnosis not present

## 2021-07-26 DIAGNOSIS — D352 Benign neoplasm of pituitary gland: Secondary | ICD-10-CM | POA: Diagnosis not present

## 2021-08-03 ENCOUNTER — Encounter: Payer: Self-pay | Admitting: Gastroenterology

## 2021-08-07 DIAGNOSIS — D352 Benign neoplasm of pituitary gland: Secondary | ICD-10-CM | POA: Diagnosis not present

## 2021-08-14 DIAGNOSIS — D352 Benign neoplasm of pituitary gland: Secondary | ICD-10-CM | POA: Diagnosis not present

## 2021-08-14 DIAGNOSIS — E119 Type 2 diabetes mellitus without complications: Secondary | ICD-10-CM | POA: Diagnosis not present

## 2021-08-14 DIAGNOSIS — Z01818 Encounter for other preprocedural examination: Secondary | ICD-10-CM | POA: Diagnosis not present

## 2021-08-14 DIAGNOSIS — Z6837 Body mass index (BMI) 37.0-37.9, adult: Secondary | ICD-10-CM | POA: Diagnosis not present

## 2021-08-14 DIAGNOSIS — I10 Essential (primary) hypertension: Secondary | ICD-10-CM | POA: Diagnosis not present

## 2021-08-14 DIAGNOSIS — E7800 Pure hypercholesterolemia, unspecified: Secondary | ICD-10-CM | POA: Diagnosis not present

## 2021-08-14 DIAGNOSIS — Z20822 Contact with and (suspected) exposure to covid-19: Secondary | ICD-10-CM | POA: Diagnosis not present

## 2021-09-28 DIAGNOSIS — E7849 Other hyperlipidemia: Secondary | ICD-10-CM | POA: Diagnosis not present

## 2021-09-28 DIAGNOSIS — I10 Essential (primary) hypertension: Secondary | ICD-10-CM | POA: Diagnosis not present

## 2021-09-28 DIAGNOSIS — E1165 Type 2 diabetes mellitus with hyperglycemia: Secondary | ICD-10-CM | POA: Diagnosis not present

## 2021-09-28 DIAGNOSIS — C751 Malignant neoplasm of pituitary gland: Secondary | ICD-10-CM | POA: Diagnosis not present

## 2021-10-04 DIAGNOSIS — D352 Benign neoplasm of pituitary gland: Secondary | ICD-10-CM | POA: Diagnosis not present

## 2021-10-04 DIAGNOSIS — D140 Benign neoplasm of middle ear, nasal cavity and accessory sinuses: Secondary | ICD-10-CM | POA: Diagnosis not present

## 2021-10-04 DIAGNOSIS — Z6837 Body mass index (BMI) 37.0-37.9, adult: Secondary | ICD-10-CM | POA: Diagnosis not present

## 2021-10-04 DIAGNOSIS — H543 Unqualified visual loss, both eyes: Secondary | ICD-10-CM | POA: Diagnosis not present

## 2021-10-04 DIAGNOSIS — J310 Chronic rhinitis: Secondary | ICD-10-CM | POA: Diagnosis not present

## 2021-10-04 DIAGNOSIS — R930 Abnormal findings on diagnostic imaging of skull and head, not elsewhere classified: Secondary | ICD-10-CM | POA: Diagnosis not present

## 2021-10-24 DIAGNOSIS — Z7982 Long term (current) use of aspirin: Secondary | ICD-10-CM | POA: Diagnosis not present

## 2021-10-24 DIAGNOSIS — Z01812 Encounter for preprocedural laboratory examination: Secondary | ICD-10-CM | POA: Diagnosis not present

## 2021-10-24 DIAGNOSIS — G935 Compression of brain: Secondary | ICD-10-CM | POA: Diagnosis not present

## 2021-10-24 DIAGNOSIS — H4749 Disorders of optic chiasm in (due to) other disorders: Secondary | ICD-10-CM | POA: Diagnosis not present

## 2021-10-24 DIAGNOSIS — I10 Essential (primary) hypertension: Secondary | ICD-10-CM | POA: Diagnosis not present

## 2021-10-24 DIAGNOSIS — D352 Benign neoplasm of pituitary gland: Secondary | ICD-10-CM | POA: Diagnosis not present

## 2021-10-24 DIAGNOSIS — Z20822 Contact with and (suspected) exposure to covid-19: Secondary | ICD-10-CM | POA: Diagnosis not present

## 2021-10-24 DIAGNOSIS — Z780 Asymptomatic menopausal state: Secondary | ICD-10-CM | POA: Diagnosis not present

## 2021-10-24 DIAGNOSIS — Z7984 Long term (current) use of oral hypoglycemic drugs: Secondary | ICD-10-CM | POA: Diagnosis not present

## 2021-10-24 DIAGNOSIS — E669 Obesity, unspecified: Secondary | ICD-10-CM | POA: Diagnosis not present

## 2021-10-24 DIAGNOSIS — Z6837 Body mass index (BMI) 37.0-37.9, adult: Secondary | ICD-10-CM | POA: Diagnosis not present

## 2021-10-24 DIAGNOSIS — Z79899 Other long term (current) drug therapy: Secondary | ICD-10-CM | POA: Diagnosis not present

## 2021-10-24 DIAGNOSIS — E7800 Pure hypercholesterolemia, unspecified: Secondary | ICD-10-CM | POA: Diagnosis not present

## 2021-10-24 DIAGNOSIS — E119 Type 2 diabetes mellitus without complications: Secondary | ICD-10-CM | POA: Diagnosis not present

## 2021-10-30 DIAGNOSIS — E876 Hypokalemia: Secondary | ICD-10-CM | POA: Diagnosis not present

## 2021-10-30 DIAGNOSIS — H547 Unspecified visual loss: Secondary | ICD-10-CM | POA: Diagnosis not present

## 2021-10-30 DIAGNOSIS — E871 Hypo-osmolality and hyponatremia: Secondary | ICD-10-CM | POA: Diagnosis not present

## 2021-10-30 DIAGNOSIS — E1165 Type 2 diabetes mellitus with hyperglycemia: Secondary | ICD-10-CM | POA: Diagnosis not present

## 2021-10-30 DIAGNOSIS — H02402 Unspecified ptosis of left eyelid: Secondary | ICD-10-CM | POA: Diagnosis not present

## 2021-10-30 DIAGNOSIS — T380X5A Adverse effect of glucocorticoids and synthetic analogues, initial encounter: Secondary | ICD-10-CM | POA: Diagnosis not present

## 2021-10-30 DIAGNOSIS — Z20822 Contact with and (suspected) exposure to covid-19: Secondary | ICD-10-CM | POA: Diagnosis not present

## 2021-10-30 DIAGNOSIS — I4510 Unspecified right bundle-branch block: Secondary | ICD-10-CM | POA: Diagnosis not present

## 2021-10-30 DIAGNOSIS — H4902 Third [oculomotor] nerve palsy, left eye: Secondary | ICD-10-CM | POA: Diagnosis not present

## 2021-10-30 DIAGNOSIS — I10 Essential (primary) hypertension: Secondary | ICD-10-CM | POA: Diagnosis not present

## 2021-10-30 DIAGNOSIS — E89810 Postprocedural hemorrhage and hematoma of an endocrine system organ or structure following an endocrine system procedure: Secondary | ICD-10-CM | POA: Diagnosis not present

## 2021-10-30 DIAGNOSIS — E119 Type 2 diabetes mellitus without complications: Secondary | ICD-10-CM | POA: Diagnosis not present

## 2021-10-30 DIAGNOSIS — Z79899 Other long term (current) drug therapy: Secondary | ICD-10-CM | POA: Diagnosis not present

## 2021-10-30 DIAGNOSIS — H5461 Unqualified visual loss, right eye, normal vision left eye: Secondary | ICD-10-CM | POA: Diagnosis not present

## 2021-10-30 DIAGNOSIS — E222 Syndrome of inappropriate secretion of antidiuretic hormone: Secondary | ICD-10-CM | POA: Diagnosis not present

## 2021-10-30 DIAGNOSIS — I161 Hypertensive emergency: Secondary | ICD-10-CM | POA: Diagnosis not present

## 2021-10-30 DIAGNOSIS — R947 Abnormal results of other endocrine function studies: Secondary | ICD-10-CM | POA: Diagnosis not present

## 2021-10-30 DIAGNOSIS — E7800 Pure hypercholesterolemia, unspecified: Secondary | ICD-10-CM | POA: Diagnosis not present

## 2021-10-30 DIAGNOSIS — R519 Headache, unspecified: Secondary | ICD-10-CM | POA: Diagnosis not present

## 2021-10-30 DIAGNOSIS — D352 Benign neoplasm of pituitary gland: Secondary | ICD-10-CM | POA: Diagnosis not present

## 2021-10-30 DIAGNOSIS — E237 Disorder of pituitary gland, unspecified: Secondary | ICD-10-CM | POA: Diagnosis not present

## 2021-10-30 DIAGNOSIS — G9762 Postprocedural hematoma of a nervous system organ or structure following other procedure: Secondary | ICD-10-CM | POA: Diagnosis not present

## 2021-10-30 DIAGNOSIS — Z7984 Long term (current) use of oral hypoglycemic drugs: Secondary | ICD-10-CM | POA: Diagnosis not present

## 2021-10-30 DIAGNOSIS — G9761 Postprocedural hematoma of a nervous system organ or structure following a nervous system procedure: Secondary | ICD-10-CM | POA: Diagnosis not present

## 2021-10-30 DIAGNOSIS — H53132 Sudden visual loss, left eye: Secondary | ICD-10-CM | POA: Diagnosis not present

## 2021-10-30 DIAGNOSIS — H5712 Ocular pain, left eye: Secondary | ICD-10-CM | POA: Diagnosis not present

## 2021-10-30 DIAGNOSIS — I160 Hypertensive urgency: Secondary | ICD-10-CM | POA: Diagnosis not present

## 2021-10-30 DIAGNOSIS — G9389 Other specified disorders of brain: Secondary | ICD-10-CM | POA: Diagnosis not present

## 2021-10-30 DIAGNOSIS — E2740 Unspecified adrenocortical insufficiency: Secondary | ICD-10-CM | POA: Diagnosis not present

## 2021-10-30 DIAGNOSIS — S04011D Injury of optic nerve, right eye, subsequent encounter: Secondary | ICD-10-CM | POA: Diagnosis not present

## 2021-10-30 DIAGNOSIS — Z9889 Other specified postprocedural states: Secondary | ICD-10-CM | POA: Diagnosis not present

## 2021-10-30 DIAGNOSIS — H5462 Unqualified visual loss, left eye, normal vision right eye: Secondary | ICD-10-CM | POA: Diagnosis not present

## 2021-10-30 DIAGNOSIS — Z794 Long term (current) use of insulin: Secondary | ICD-10-CM | POA: Diagnosis not present

## 2021-10-30 DIAGNOSIS — Z5181 Encounter for therapeutic drug level monitoring: Secondary | ICD-10-CM | POA: Diagnosis not present

## 2021-11-07 DIAGNOSIS — D352 Benign neoplasm of pituitary gland: Secondary | ICD-10-CM | POA: Diagnosis not present

## 2021-11-08 DIAGNOSIS — D352 Benign neoplasm of pituitary gland: Secondary | ICD-10-CM | POA: Diagnosis not present

## 2021-11-08 DIAGNOSIS — I10 Essential (primary) hypertension: Secondary | ICD-10-CM | POA: Diagnosis not present

## 2021-11-08 DIAGNOSIS — J310 Chronic rhinitis: Secondary | ICD-10-CM | POA: Diagnosis not present

## 2021-11-08 DIAGNOSIS — Z6836 Body mass index (BMI) 36.0-36.9, adult: Secondary | ICD-10-CM | POA: Diagnosis not present

## 2021-11-09 DIAGNOSIS — E1165 Type 2 diabetes mellitus with hyperglycemia: Secondary | ICD-10-CM | POA: Diagnosis not present

## 2021-11-09 DIAGNOSIS — C751 Malignant neoplasm of pituitary gland: Secondary | ICD-10-CM | POA: Diagnosis not present

## 2021-11-09 DIAGNOSIS — I10 Essential (primary) hypertension: Secondary | ICD-10-CM | POA: Diagnosis not present

## 2021-12-05 DIAGNOSIS — I10 Essential (primary) hypertension: Secondary | ICD-10-CM | POA: Diagnosis not present

## 2021-12-05 DIAGNOSIS — M179 Osteoarthritis of knee, unspecified: Secondary | ICD-10-CM | POA: Diagnosis not present

## 2021-12-05 DIAGNOSIS — E1165 Type 2 diabetes mellitus with hyperglycemia: Secondary | ICD-10-CM | POA: Diagnosis not present

## 2021-12-05 DIAGNOSIS — C751 Malignant neoplasm of pituitary gland: Secondary | ICD-10-CM | POA: Diagnosis not present

## 2021-12-13 DIAGNOSIS — J310 Chronic rhinitis: Secondary | ICD-10-CM | POA: Diagnosis not present

## 2021-12-19 DIAGNOSIS — C751 Malignant neoplasm of pituitary gland: Secondary | ICD-10-CM | POA: Diagnosis not present

## 2021-12-19 DIAGNOSIS — I10 Essential (primary) hypertension: Secondary | ICD-10-CM | POA: Diagnosis not present

## 2021-12-19 DIAGNOSIS — E1165 Type 2 diabetes mellitus with hyperglycemia: Secondary | ICD-10-CM | POA: Diagnosis not present

## 2022-01-02 DIAGNOSIS — D352 Benign neoplasm of pituitary gland: Secondary | ICD-10-CM | POA: Diagnosis not present

## 2022-01-02 DIAGNOSIS — Z135 Encounter for screening for eye and ear disorders: Secondary | ICD-10-CM | POA: Diagnosis not present

## 2022-01-02 DIAGNOSIS — H538 Other visual disturbances: Secondary | ICD-10-CM | POA: Diagnosis not present

## 2022-01-24 DIAGNOSIS — J310 Chronic rhinitis: Secondary | ICD-10-CM | POA: Diagnosis not present

## 2022-01-29 DIAGNOSIS — Z86011 Personal history of benign neoplasm of the brain: Secondary | ICD-10-CM | POA: Diagnosis not present

## 2022-01-29 DIAGNOSIS — Z09 Encounter for follow-up examination after completed treatment for conditions other than malignant neoplasm: Secondary | ICD-10-CM | POA: Diagnosis not present

## 2022-01-29 DIAGNOSIS — D352 Benign neoplasm of pituitary gland: Secondary | ICD-10-CM | POA: Diagnosis not present

## 2022-01-29 DIAGNOSIS — Z9889 Other specified postprocedural states: Secondary | ICD-10-CM | POA: Diagnosis not present

## 2022-01-29 DIAGNOSIS — R9089 Other abnormal findings on diagnostic imaging of central nervous system: Secondary | ICD-10-CM | POA: Diagnosis not present

## 2022-02-05 DIAGNOSIS — D352 Benign neoplasm of pituitary gland: Secondary | ICD-10-CM | POA: Diagnosis not present

## 2022-02-05 DIAGNOSIS — Z6836 Body mass index (BMI) 36.0-36.9, adult: Secondary | ICD-10-CM | POA: Diagnosis not present

## 2022-02-28 DIAGNOSIS — D352 Benign neoplasm of pituitary gland: Secondary | ICD-10-CM | POA: Diagnosis not present

## 2022-03-05 DIAGNOSIS — Z1211 Encounter for screening for malignant neoplasm of colon: Secondary | ICD-10-CM | POA: Diagnosis not present

## 2022-03-05 DIAGNOSIS — Z01419 Encounter for gynecological examination (general) (routine) without abnormal findings: Secondary | ICD-10-CM | POA: Diagnosis not present

## 2022-03-05 DIAGNOSIS — Z6836 Body mass index (BMI) 36.0-36.9, adult: Secondary | ICD-10-CM | POA: Diagnosis not present

## 2022-03-05 DIAGNOSIS — Z1239 Encounter for other screening for malignant neoplasm of breast: Secondary | ICD-10-CM | POA: Diagnosis not present

## 2022-03-09 DIAGNOSIS — Z1231 Encounter for screening mammogram for malignant neoplasm of breast: Secondary | ICD-10-CM | POA: Diagnosis not present

## 2022-03-27 DIAGNOSIS — J310 Chronic rhinitis: Secondary | ICD-10-CM | POA: Diagnosis not present

## 2022-04-02 ENCOUNTER — Other Ambulatory Visit: Payer: Self-pay | Admitting: Obstetrics and Gynecology

## 2022-04-02 DIAGNOSIS — R928 Other abnormal and inconclusive findings on diagnostic imaging of breast: Secondary | ICD-10-CM

## 2022-04-11 ENCOUNTER — Ambulatory Visit
Admission: RE | Admit: 2022-04-11 | Discharge: 2022-04-11 | Disposition: A | Discharge status: Home / Self Care | Payer: BC Managed Care – PPO | Source: Ambulatory Visit | Attending: Obstetrics and Gynecology | Admitting: Obstetrics and Gynecology

## 2022-04-11 ENCOUNTER — Other Ambulatory Visit: Payer: Self-pay | Admitting: Obstetrics and Gynecology

## 2022-04-11 DIAGNOSIS — N6489 Other specified disorders of breast: Secondary | ICD-10-CM | POA: Diagnosis not present

## 2022-04-11 DIAGNOSIS — R928 Other abnormal and inconclusive findings on diagnostic imaging of breast: Secondary | ICD-10-CM

## 2022-04-11 DIAGNOSIS — N6310 Unspecified lump in the right breast, unspecified quadrant: Secondary | ICD-10-CM

## 2022-04-11 DIAGNOSIS — R922 Inconclusive mammogram: Secondary | ICD-10-CM | POA: Diagnosis not present

## 2022-04-12 DIAGNOSIS — E559 Vitamin D deficiency, unspecified: Secondary | ICD-10-CM | POA: Diagnosis not present

## 2022-04-12 DIAGNOSIS — I10 Essential (primary) hypertension: Secondary | ICD-10-CM | POA: Diagnosis not present

## 2022-04-12 DIAGNOSIS — Z0000 Encounter for general adult medical examination without abnormal findings: Secondary | ICD-10-CM | POA: Diagnosis not present

## 2022-04-12 DIAGNOSIS — E7849 Other hyperlipidemia: Secondary | ICD-10-CM | POA: Diagnosis not present

## 2022-04-12 DIAGNOSIS — E1165 Type 2 diabetes mellitus with hyperglycemia: Secondary | ICD-10-CM | POA: Diagnosis not present

## 2022-06-13 DIAGNOSIS — S161XXA Strain of muscle, fascia and tendon at neck level, initial encounter: Secondary | ICD-10-CM | POA: Diagnosis not present

## 2022-07-17 DIAGNOSIS — K219 Gastro-esophageal reflux disease without esophagitis: Secondary | ICD-10-CM | POA: Diagnosis not present

## 2022-07-17 DIAGNOSIS — I10 Essential (primary) hypertension: Secondary | ICD-10-CM | POA: Diagnosis not present

## 2022-07-17 DIAGNOSIS — E7849 Other hyperlipidemia: Secondary | ICD-10-CM | POA: Diagnosis not present

## 2022-07-17 DIAGNOSIS — E1165 Type 2 diabetes mellitus with hyperglycemia: Secondary | ICD-10-CM | POA: Diagnosis not present

## 2022-08-30 DIAGNOSIS — D352 Benign neoplasm of pituitary gland: Secondary | ICD-10-CM | POA: Diagnosis not present

## 2022-10-12 ENCOUNTER — Other Ambulatory Visit: Payer: Self-pay | Admitting: Obstetrics and Gynecology

## 2022-10-12 ENCOUNTER — Ambulatory Visit
Admission: RE | Admit: 2022-10-12 | Discharge: 2022-10-12 | Disposition: A | Discharge status: Home / Self Care | Payer: BC Managed Care – PPO | Source: Ambulatory Visit | Attending: Obstetrics and Gynecology | Admitting: Obstetrics and Gynecology

## 2022-10-12 DIAGNOSIS — N6310 Unspecified lump in the right breast, unspecified quadrant: Secondary | ICD-10-CM

## 2022-10-12 DIAGNOSIS — N6314 Unspecified lump in the right breast, lower inner quadrant: Secondary | ICD-10-CM | POA: Diagnosis not present

## 2023-02-28 ENCOUNTER — Other Ambulatory Visit: Payer: Self-pay | Admitting: Internal Medicine

## 2023-03-03 LAB — COMPLETE METABOLIC PANEL WITH GFR
AG Ratio: 1.3 (calc) (ref 1.0–2.5)
ALT: 15 U/L (ref 6–29)
AST: 16 U/L (ref 10–35)
Albumin: 3.9 g/dL (ref 3.6–5.1)
Alkaline phosphatase (APISO): 81 U/L (ref 37–153)
BUN: 17 mg/dL (ref 7–25)
CO2: 23 mmol/L (ref 20–32)
Calcium: 9.4 mg/dL (ref 8.6–10.4)
Chloride: 105 mmol/L (ref 98–110)
Creat: 0.86 mg/dL (ref 0.50–1.03)
Globulin: 3.1 g/dL (calc) (ref 1.9–3.7)
Glucose, Bld: 192 mg/dL — ABNORMAL HIGH (ref 65–99)
Potassium: 3.5 mmol/L (ref 3.5–5.3)
Sodium: 139 mmol/L (ref 135–146)
Total Bilirubin: 0.8 mg/dL (ref 0.2–1.2)
Total Protein: 7 g/dL (ref 6.1–8.1)
eGFR: 79 mL/min/{1.73_m2} (ref >=60)

## 2023-03-03 LAB — CBC
HCT: 35.6 % (ref 35.0–45.0)
Hemoglobin: 11.6 g/dL — ABNORMAL LOW (ref 11.7–15.5)
MCH: 27 pg (ref 27.0–33.0)
MCHC: 32.6 g/dL (ref 32.0–36.0)
MCV: 82.8 fL (ref 80.0–100.0)
MPV: 10.1 fL (ref 7.5–12.5)
Platelets: 340 10*3/uL (ref 140–400)
RBC: 4.3 10*6/uL (ref 3.80–5.10)
RDW: 13.7 % (ref 11.0–15.0)
WBC: 6.6 10*3/uL (ref 3.8–10.8)

## 2023-03-03 LAB — VITAMIN D 25 HYDROXY (VIT D DEFICIENCY, FRACTURES): Vit D, 25-Hydroxy: 41 ng/mL (ref 30–100)

## 2023-03-03 LAB — T3 UPTAKE: T3 Uptake: 31 % (ref 22–35)

## 2023-03-03 LAB — LIPID PANEL
Cholesterol: 152 mg/dL (ref <200)
HDL: 59 mg/dL (ref >=50)
LDL Cholesterol (Calc): 75 mg/dL (calc)
Non-HDL Cholesterol (Calc): 93 mg/dL (calc) (ref <130)
Total CHOL/HDL Ratio: 2.6 (calc) (ref <5.0)
Triglycerides: 96 mg/dL (ref <150)

## 2023-03-03 LAB — T3: T3, Total: 118 ng/dL (ref 76–181)

## 2023-03-03 LAB — TSH: TSH: 7.66 mIU/L — ABNORMAL HIGH (ref 0.40–4.50)

## 2023-03-03 LAB — T4, FREE: Free T4: 1.2 ng/dL (ref 0.8–1.8)

## 2023-03-12 ENCOUNTER — Encounter: Payer: Self-pay | Admitting: Gastroenterology

## 2023-03-13 ENCOUNTER — Ambulatory Visit
Admission: RE | Admit: 2023-03-13 | Discharge: 2023-03-13 | Disposition: A | Discharge status: Home / Self Care | Payer: BC Managed Care – PPO | Source: Ambulatory Visit | Attending: Obstetrics and Gynecology | Admitting: Obstetrics and Gynecology

## 2023-03-13 DIAGNOSIS — N6310 Unspecified lump in the right breast, unspecified quadrant: Secondary | ICD-10-CM

## 2023-03-14 ENCOUNTER — Other Ambulatory Visit: Payer: Self-pay | Admitting: Obstetrics and Gynecology

## 2023-03-14 DIAGNOSIS — R921 Mammographic calcification found on diagnostic imaging of breast: Secondary | ICD-10-CM

## 2023-04-11 ENCOUNTER — Ambulatory Visit: Payer: BC Managed Care – PPO | Admitting: *Deleted

## 2023-04-11 VITALS — Ht 63.0 in | Wt 200.0 lb

## 2023-04-11 DIAGNOSIS — Z86010 Personal history of colonic polyps: Secondary | ICD-10-CM

## 2023-04-11 MED ORDER — NA SULFATE-K SULFATE-MG SULF 17.5-3.13-1.6 GM/177ML PO SOLN: 1.0000 | Freq: Once | ORAL | 0 refills | Status: AC

## 2023-04-11 NOTE — Progress Notes (Signed)

## 2023-04-12 ENCOUNTER — Encounter: Payer: Self-pay | Admitting: Gastroenterology

## 2023-05-09 ENCOUNTER — Ambulatory Visit (AMBULATORY_SURGERY_CENTER): Payer: BC Managed Care – PPO | Admitting: Gastroenterology

## 2023-05-09 ENCOUNTER — Encounter: Payer: Self-pay | Admitting: Gastroenterology

## 2023-05-09 VITALS — BP 130/77 | HR 59 | Temp 97.8°F | Resp 22 | Ht 63.0 in | Wt 200.0 lb

## 2023-05-09 DIAGNOSIS — Z09 Encounter for follow-up examination after completed treatment for conditions other than malignant neoplasm: Secondary | ICD-10-CM | POA: Diagnosis not present

## 2023-05-09 DIAGNOSIS — Z86010 Personal history of colonic polyps: Secondary | ICD-10-CM

## 2023-05-09 MED ORDER — SODIUM CHLORIDE 0.9 % IV SOLN: 500.0000 mL | INTRAVENOUS | Status: AC

## 2023-05-09 NOTE — Progress Notes (Signed)
Sedate, gd SR's, VSS, report to RN 

## 2023-05-09 NOTE — Op Note (Signed)
[facility] Patient Name: Rebecca Humphrey Procedure Date: 05/09/2023 1:55 PM MRN: [id number] Endoscopist: Sherilyn Cooter L. Myrtie Neither , MD, [id number] Age: 57 Referring MD:  Date of Birth: December 04, 1965 Gender: Female Account #: [id number] Procedure:                Colonoscopy Indications:              Surveillance: Personal history of adenomatous                            polyps on last colonoscopy > 5 years ago                           Diminutive tubular adenoma x 2 on first colonoscopy                            June 2017 Medicines:                Monitored Anesthesia Care Procedure:                Pre-Anesthesia Assessment:                           - Prior to the procedure, a History and Physical                            was performed, and patient medications and                            allergies were reviewed. The patient's tolerance of                            previous anesthesia was also reviewed. The risks                            and benefits of the procedure and the sedation                            options and risks were discussed with the patient.                            All questions were answered, and informed consent                            was obtained. Prior Anticoagulants: The patient has                            taken no anticoagulant or antiplatelet agents. ASA                            Grade Assessment: III - A patient with severe                            systemic disease. After reviewing the risks and  benefits, the patient was deemed in satisfactory                            condition to undergo the procedure.                           After obtaining informed consent, the colonoscope                            was passed under direct vision. Throughout the                            procedure, the patient's blood pressure, pulse, and                            oxygen saturations were monitored continuously. The                             CF [id number] #[id number] was introduced through the anus                            and advanced to the the cecum, identified by                            appendiceal orifice and ileocecal valve. The                            colonoscopy was performed without difficulty. The                            patient tolerated the procedure well. The quality                            of the bowel preparation was good. The ileocecal                            valve, appendiceal orifice, and rectum were                            photographed. Scope In: 2:11:26 PM Scope Out: 2:20:55 PM Scope Withdrawal Time: 0 hours 7 minutes 18 seconds  Total Procedure Duration: 0 hours 9 minutes 29 seconds  Findings:                 The perianal and digital rectal examinations were                            normal.                           Repeat examination of right colon under NBI                            performed.  Multiple diverticula were found in the left colon                            and right colon.                           The exam was otherwise without abnormality on                            direct and retroflexion views. Complications:            No immediate complications. Estimated Blood Loss:     Estimated blood loss: none. Impression:               - Diverticulosis in the left colon and in the right                            colon.                           - The examination was otherwise normal on direct                            and retroflexion views.                           - No specimens collected. Recommendation:           - Patient has a contact number available for                            emergencies. The signs and symptoms of potential                            delayed complications were discussed with the                            patient. Return to normal activities tomorrow.                            Written discharge  instructions were provided to the                            patient.                           - Resume previous diet.                           - Continue present medications.                           - Repeat colonoscopy in 10 years for screening                            purposes. Rebecca Humphrey L. Myrtie Neither, MD 05/09/2023 2:24:09 PM This report has been signed electronically.

## 2023-05-09 NOTE — Progress Notes (Signed)
Pt's states no medical or surgical changes since previsit or office visit. 

## 2023-05-09 NOTE — Patient Instructions (Signed)
You may resume your previous medications today as ordered.  Read the handouts given to you y your recovery room nurse.  YOU HAD AN ENDOSCOPIC PROCEDURE TODAY AT THE [facility]:   Refer to the procedure report that was given to you for any specific questions about what was found during the examination.  If the procedure report does not answer your questions, please call your gastroenterologist to clarify.  If you requested that your care partner not be given the details of your procedure findings, then the procedure report has been included in a sealed envelope for you to review at your convenience later.  YOU SHOULD EXPECT: Some feelings of bloating in the abdomen. Passage of more gas than usual.  Walking can help get rid of the air that was put into your GI tract during the procedure and reduce the bloating. If you had a lower endoscopy (such as a colonoscopy or flexible sigmoidoscopy) you may notice spotting of blood in your stool or on the toilet paper. If you underwent a bowel prep for your procedure, you may not have a normal bowel movement for a few days.  Please Note:  You might notice some irritation and congestion in your nose or some drainage.  This is from the oxygen used during your procedure.  There is no need for concern and it should clear up in a day or so.  SYMPTOMS TO REPORT IMMEDIATELY:  Following lower endoscopy (colonoscopy or flexible sigmoidoscopy):  Excessive amounts of blood in the stool  Significant tenderness or worsening of abdominal pains  Swelling of the abdomen that is new, acute  Fever of 100F or higher  For urgent or emergent issues, a gastroenterologist can be reached at any hour by calling [phone]. Do not use MyChart messaging for urgent concerns.    DIET:  We do recommend a small meal at first, but then you may proceed to your regular diet.  Drink plenty of fluids but you should avoid alcoholic beverages for 24 hours.  ACTIVITY:  You  should plan to take it easy for the rest of today and you should NOT DRIVE or use heavy machinery until tomorrow (because of the sedation medicines used during the test).    FOLLOW UP: Our staff will call the number listed on your records the next business day following your procedure.  We will call around 7:15- 8:00 am to check on you and address any questions or concerns that you may have regarding the information given to you following your procedure. If we do not reach you, we will leave a message.     If any biopsies were taken you will be contacted by phone or by letter within the next 1-3 weeks.  Please call us at [phone] if you have not heard about the biopsies in 3 weeks.    SIGNATURES/CONFIDENTIALITY: You and/or your care partner have signed paperwork which will be entered into your electronic medical record.  These signatures attest to the fact that that the information above on your After Visit Summary has been reviewed and is understood.  Full responsibility of the confidentiality of this discharge information lies with you and/or your care-partner.

## 2023-05-09 NOTE — Progress Notes (Signed)
History and Physical:  This patient presents for endoscopic testing for: Encounter Diagnosis  Name Primary?   Personal history of colonic polyps Yes   Surveillance for diminutive TA x 23 March 2016 Patient denies chronic abdominal pain, rectal bleeding, constipation or diarrhea.   Patient is otherwise without complaints or active issues today.   Past Medical History: Past Medical History:  Diagnosis Date   Anemia    Arthritis    Asherman's syndrome    fibroid disease - no surgery with this per pt.   Carotid stenosis, asymptomatic    Bilateral ICA 1-39%  and mild ECA stenosis  per duplex 05-21-2014   Diabetes mellitus without complication (HCC)    GERD (gastroesophageal reflux disease)    Heart murmur    History of ectopic pregnancy    Treated with methotrexated in 2007 right side   Hyperlipidemia    Hypertension    Infertility, female    Submucous myoma of uterus    Wears glasses      Past Surgical History: Past Surgical History:  Procedure Laterality Date   CESAREAN SECTION  09/29/2007   COLONOSCOPY     PITUITARY SURGERY     2023   TRANSTHORACIC ECHOCARDIOGRAM  05/21/2014   grade I diastolic dysfunction/  ef 60-65%/  mild AR and  MR/  mild LAE    Allergies: Allergies  Allergen Reactions   Ciprofloxacin Nausea Only    "Sick within her"    Outpatient Meds: Current Outpatient Medications  Medication Sig Dispense Refill   acetaminophen (TYLENOL) 500 MG tablet Take 500 mg by mouth every 8 (eight) hours as needed.     aspirin 81 MG tablet Take 81 mg by mouth daily.     atorvastatin (LIPITOR) 40 MG tablet Take 40 mg by mouth daily.     losartan-hydrochlorothiazide (HYZAAR) 100-12.5 MG tablet Take 1 tablet by mouth daily.     meloxicam (MOBIC) 15 MG tablet Take 15 mg by mouth daily. PRN     metFORMIN (GLUCOPHAGE) 500 MG tablet Take 500 mg by mouth 3 (three) times daily.     omeprazole (PRILOSEC) 20 MG capsule Take 20 mg by mouth every morning.       Cholecalciferol (VITAMIN D3) 1000 UNITS CAPS Take 1 capsule by mouth daily. Reported on 04/03/2016 (Patient not taking: Reported on 05/09/2023)     ferrous sulfate 325 (65 FE) MG tablet Take 325 mg by mouth daily with breakfast. (Patient not taking: Reported on 05/09/2023)     gabapentin (NEURONTIN) 100 MG capsule PRN (Patient not taking: Reported on 05/09/2023)     glucose blood (ONETOUCH VERIO) test strip TEST STRIPS FOR EVERY DAY GLUCOSE TESTING     hydrochlorothiazide (HYDRODIURIL) 25 MG tablet Take 25 mg by mouth every morning.      meclizine (ANTIVERT) 25 MG tablet PRN     potassium chloride (K-DUR) 10 MEQ tablet Take 10 mEq by mouth every morning.      simvastatin (ZOCOR) 20 MG tablet Take 20 mg by mouth daily at 6 PM. (Patient not taking: Reported on 05/09/2023)     Current Facility-Administered Medications  Medication Dose Route Frequency Provider Last Rate Last Admin   0.9 %  sodium chloride infusion  500 mL Intravenous Continuous Danis, Starr Lake III, MD          ___________________________________________________________________ Objective   Exam:  BP (!) 141/74   Pulse 64   Temp 97.8 F (36.6 C)   Ht 5\' 3"  (1.6 m)  Wt 200 lb (90.7 kg)   LMP 06/28/2015   SpO2 100%   BMI 35.43 kg/m   CV: regular , S1/S2 Resp: clear to auscultation bilaterally, normal RR and effort noted GI: soft, no tenderness, with active bowel sounds.   Assessment: Encounter Diagnosis  Name Primary?   Personal history of colonic polyps Yes     Plan: Colonoscopy   The benefits and risks of the planned procedure were described in detail with the patient or (when appropriate) their health care proxy.  Risks were outlined as including, but not limited to, bleeding, infection, perforation, adverse medication reaction leading to cardiac or pulmonary decompensation, pancreatitis (if ERCP).  The limitation of incomplete mucosal visualization was also discussed.  No guarantees or warranties were  given.  The patient is appropriate for an endoscopic procedure in the ambulatory setting.   - Rebecca Jupiter, MD

## 2023-05-10 ENCOUNTER — Telehealth: Payer: Self-pay | Admitting: *Deleted

## 2023-05-10 NOTE — Telephone Encounter (Signed)
  Follow up Call-     05/09/2023    1:09 PM  Call back number  Post procedure Call Back phone  # [phone]  Permission to leave phone message Yes     Patient questions:  Unable to leave a message.  Person answered the phone and didn't speak.

## 2023-09-23 ENCOUNTER — Other Ambulatory Visit: Payer: Self-pay | Admitting: Obstetrics and Gynecology

## 2023-09-23 ENCOUNTER — Ambulatory Visit
Admission: RE | Admit: 2023-09-23 | Discharge: 2023-09-23 | Disposition: A | Discharge status: Home / Self Care | Payer: BC Managed Care – PPO | Source: Ambulatory Visit | Attending: Obstetrics and Gynecology | Admitting: Obstetrics and Gynecology

## 2023-09-23 DIAGNOSIS — R921 Mammographic calcification found on diagnostic imaging of breast: Secondary | ICD-10-CM

## 2023-09-23 DIAGNOSIS — N6310 Unspecified lump in the right breast, unspecified quadrant: Secondary | ICD-10-CM

## 2024-03-23 ENCOUNTER — Ambulatory Visit
Admission: RE | Admit: 2024-03-23 | Discharge: 2024-03-23 | Disposition: A | Discharge status: Home / Self Care | Payer: BC Managed Care – PPO | Source: Ambulatory Visit | Attending: Obstetrics and Gynecology | Admitting: Obstetrics and Gynecology

## 2024-03-23 DIAGNOSIS — R921 Mammographic calcification found on diagnostic imaging of breast: Secondary | ICD-10-CM

## 2024-03-23 DIAGNOSIS — N6310 Unspecified lump in the right breast, unspecified quadrant: Secondary | ICD-10-CM

## 2024-11-04 ENCOUNTER — Encounter: Payer: Self-pay | Admitting: Obstetrics and Gynecology

## 2024-11-04 DIAGNOSIS — R921 Mammographic calcification found on diagnostic imaging of breast: Secondary | ICD-10-CM

## 2024-11-05 ENCOUNTER — Other Ambulatory Visit: Payer: Self-pay | Admitting: Obstetrics and Gynecology

## 2024-11-05 DIAGNOSIS — R921 Mammographic calcification found on diagnostic imaging of breast: Secondary | ICD-10-CM

## 2024-11-12 ENCOUNTER — Encounter: Payer: Self-pay | Admitting: Gastroenterology

## 2024-11-12 ENCOUNTER — Ambulatory Visit: Admitting: Gastroenterology

## 2024-11-12 VITALS — BP 114/76 | HR 91 | Ht 63.0 in | Wt 201.4 lb

## 2024-11-12 DIAGNOSIS — R1314 Dysphagia, pharyngoesophageal phase: Secondary | ICD-10-CM | POA: Diagnosis not present

## 2024-11-12 NOTE — Progress Notes (Signed)
 "     [name] Progress Note  Chief Complaint: Dysphagia  Subjective  Prior history  Screening colonoscopy with Dr. Legrand June 2017 -2 diminutive tubular adenomas No polyps surveillance colonoscopy July 2024   Discussed the use of AI scribe software for clinical note transcription with the patient, who gave verbal consent to proceed.  History of Present Illness Rebecca Humphrey is a 59 year old female with prior pituitary tumor resection who presents for evaluation of chronic dysphagia and intermittent aspiration.  Dysphagia and possible aspiration: - Difficulty swallowing for the past year or longer - Intermittent episodes of aspiration during eating, occurring approximately two to three times per week - Aspiration more frequent with dry foods, but also occurs with water - Food or liquid occasionally enters airway, prompting coughing to clear - Able to finish meals, though small portions may trigger coughing and sensation of aspiration - Sometimes requires pausing and coughing until sensation resolves - No persistent changes in voice, but transient changes may occur during coughing episodes; voice returns to baseline afterward - Occasional nausea, not consistently associated with episodes - No unintentional weight loss - Remains cautious when eating and drinking to minimize aspiration episodes, but otherwise completes meals  Cough and Heartburn History: - Previously used omeprazole for heartburn, started around 2017-2018 - Discontinued omeprazole over a year ago due to increased coughing, which she attributed to the medication - Coughing symptoms have persisted since stopping omeprazole   Neurological History: - No history of stroke, Parkinson's disease, or other chronic neurological conditions  Family History: - No family history of swallowing issues    ROS: Cardiovascular:  no chest pain Respiratory: no dyspnea, other than feelings of wheezing after an episode  described above She has not noticed any change in vocal quality The patient's Past Medical, Family and Social History were reviewed and are on file in the EMR. Past Medical History:  Diagnosis Date   Anemia    Arthritis    Asherman's syndrome    fibroid disease - no surgery with this per pt.   Carotid stenosis, asymptomatic    Bilateral ICA 1-39%  and mild ECA stenosis  per duplex 05-21-2014   Diabetes mellitus without complication (HCC)    GERD (gastroesophageal reflux disease)    Heart murmur    History of ectopic pregnancy    Treated with methotrexated in 2007 right side   Hyperlipidemia    Hypertension    Infertility, female    Submucous myoma of uterus    Wears glasses    Pituitary microadenoma-underwent transsphenoidal resection in 2023 by [facility] neurosurgery. Under annual MRI surveillance (stable last visit with neurosurgery May 2025)  Past Surgical History:  Procedure Laterality Date   CESAREAN SECTION  09/29/2007   COLONOSCOPY     PITUITARY SURGERY     2023   TRANSTHORACIC ECHOCARDIOGRAM  05/21/2014   grade I diastolic dysfunction/  ef 60-65%/  mild AR and  MR/  mild LAE   Social history:  Nurse  Non-smoker  Objective:  Med list reviewed Current Medications[1]   Vital signs in last 24 hrs: Vitals:   11/12/24 1016  BP: 114/76  Pulse: 91   Wt Readings from Last 3 Encounters:  11/12/24 201 lb 6 oz (91.3 kg)  05/09/23 200 lb (90.7 kg)  04/11/23 200 lb (90.7 kg)    Physical Exam  Well-appearing, normal vocal quality. HEENT: sclera anicteric, oral mucosa moist without lesions Neck: supple, no thyromegaly, JVD or lymphadenopathy Cardiac: Regular without  appreciable murmur,  no peripheral edema Pulm: clear to auscultation bilaterally, normal RR and effort noted Abdomen: soft, no tenderness, with active bowel sounds. No guarding or palpable hepatosplenomegaly. Skin; warm and dry, no jaundice or rash Extraocular muscles intact, tongue protrudes midline,  normal gross motor function upper and lower extremities  Labs:   ___________________________________________ Radiologic studies:   ____________________________________________ Other:   _____________________________________________   Encounter Diagnosis  Name Primary?   Dysphagia, pharyngoesophageal phase Yes    Assessment & Plan Dysphagia Chronic, intermittent pharyngeal esophageal l dysphagia with episodes of coughing and possible aspiration.  Difficult to say for certain at this juncture, but without clear risk factors such chronic neurologic conditions, seems to be most likely due to a mechanical cause in the proximal esophagus then a motility issue.  Discussed starting with either upper endoscopy or barium study.  I favor the EGD to rule out a mechanical cause and perform dilation if needed.  If that is unrevealing, then really a modified barium study would be better than that a standard barium esophagram. - Scheduled upper endoscopy (EGD) within the next few weeks - She was agreeable after thorough discussion of procedure and risks  The benefits and risks of the planned procedure(s) were described in detail with the patient or (when appropriate) their health care proxy.  Risks were outlined as including, but not limited to, bleeding, infection, perforation, adverse medication reaction leading to cardiac or pulmonary decompensation, pancreatitis (if ERCP).  The limitation of incomplete mucosal visualization was also discussed.  No guarantees or warranties were given.    I spent total of 32 minutes in both face-to-face (22 minutes interview/exam) and non-face-to-face (10 minutes chart review, care coordination, documentation)  activities, excluding procedures performed, for the visit on the date of this encounter.   Victory LITTIE Brand III     [1]  Current Outpatient Medications:    acetaminophen (TYLENOL) 500 MG tablet, Take 500 mg by mouth every 8 (eight) hours as needed.,  Disp: , Rfl:    amLODipine (NORVASC) 5 MG tablet, Take 5 mg by mouth daily., Disp: , Rfl:    aspirin 81 MG tablet, Take 81 mg by mouth daily., Disp: , Rfl:    atorvastatin (LIPITOR) 40 MG tablet, Take 40 mg by mouth daily., Disp: , Rfl:    Cholecalciferol (VITAMIN D3) 1000 UNITS CAPS, Take 1 capsule by mouth daily. Reported on 04/03/2016, Disp: , Rfl:    ferrous sulfate 325 (65 FE) MG tablet, Take 325 mg by mouth daily with breakfast., Disp: , Rfl:    glucose blood (ONETOUCH VERIO) test strip, TEST STRIPS FOR EVERY DAY GLUCOSE TESTING, Disp: , Rfl:    ketoconazole (NIZORAL) 2 % shampoo, APPLY TOPICALLY TO THE AFFECTED AREA 2 TIMES A WEEK, Disp: , Rfl:    losartan-hydrochlorothiazide (HYZAAR) 100-25 MG tablet, Take 1 tablet by mouth daily., Disp: , Rfl:    meclizine (ANTIVERT) 25 MG tablet, PRN, Disp: , Rfl:    metFORMIN (GLUCOPHAGE) 500 MG tablet, Take 500 mg by mouth 3 (three) times daily., Disp: , Rfl:    gabapentin (NEURONTIN) 100 MG capsule, PRN (Patient not taking: Reported on 11/12/2024), Disp: , Rfl:    meloxicam (MOBIC) 15 MG tablet, Take 15 mg by mouth daily. PRN (Patient not taking: Reported on 11/12/2024), Disp: , Rfl:    omeprazole (PRILOSEC) 20 MG capsule, Take 20 mg by mouth every morning.  (Patient not taking: Reported on 11/12/2024), Disp: , Rfl:    simvastatin (ZOCOR) 20 MG tablet, Take  20 mg by mouth daily at 6 PM. (Patient not taking: Reported on 11/12/2024), Disp: , Rfl:   Current Facility-Administered Medications:    0.9 %  sodium chloride  infusion, 500 mL, Intravenous, Continuous, Danis, Victory LITTIE MOULD, MD  "

## 2024-11-12 NOTE — Patient Instructions (Signed)
 You have been scheduled for an endoscopy. Please follow written instructions given to you at your visit today.  If you use inhalers (even only as needed), please bring them with you on the day of your procedure.  If you take any of the following medications, they will need to be adjusted prior to your procedure:   DO NOT TAKE 7 DAYS PRIOR TO TEST- Trulicity (dulaglutide) Ozempic, Wegovy (semaglutide) Mounjaro, Zepbound (tirzepatide) Bydureon Bcise (exanatide extended release)  DO NOT TAKE 1 DAY PRIOR TO YOUR TEST Rybelsus (semaglutide) Adlyxin (lixisenatide) Victoza (liraglutide) Byetta (exanatide) ___________________________________________________________________________  Thank you for trusting me with your gastrointestinal care!    Dr. Victory Legrand [facility]

## 2024-11-17 ENCOUNTER — Encounter: Payer: Self-pay | Admitting: *Deleted

## 2024-11-19 ENCOUNTER — Ambulatory Visit: Admitting: Gastroenterology

## 2024-11-19 ENCOUNTER — Encounter: Payer: Self-pay | Admitting: Gastroenterology

## 2024-11-19 VITALS — BP 118/75 | HR 56 | Temp 97.2°F | Resp 21 | Ht 63.0 in | Wt 201.0 lb

## 2024-11-19 DIAGNOSIS — K2960 Other gastritis without bleeding: Secondary | ICD-10-CM

## 2024-11-19 DIAGNOSIS — R1314 Dysphagia, pharyngoesophageal phase: Secondary | ICD-10-CM

## 2024-11-19 DIAGNOSIS — K257 Chronic gastric ulcer without hemorrhage or perforation: Secondary | ICD-10-CM

## 2024-11-19 DIAGNOSIS — K259 Gastric ulcer, unspecified as acute or chronic, without hemorrhage or perforation: Secondary | ICD-10-CM

## 2024-11-19 DIAGNOSIS — K222 Esophageal obstruction: Secondary | ICD-10-CM

## 2024-11-19 MED ORDER — SODIUM CHLORIDE 0.9 % IV SOLN: 500.0000 mL | Freq: Once | INTRAVENOUS | Status: AC

## 2024-11-19 NOTE — Op Note (Signed)
 [facility] Patient Name: Rebecca Humphrey Procedure Date: 11/19/2024 1:14 PM MRN: [id number] Endoscopist: Victory L. Legrand , MD, [id number] Age: 59 Referring MD:  Date of Birth: 01/26/1966 Gender: Female Account #: [id number] Procedure:                Upper GI endoscopy Indications:              Pharyngo-esophageal phase dysphagia                           coughing when eating - concerns for aspiration                           clinical details in 11/12/24 office note Medicines:                Monitored Anesthesia Care Procedure:                Pre-Anesthesia Assessment:                           - Prior to the procedure, a History and Physical                            was performed, and patient medications and                            allergies were reviewed. The patient's tolerance of                            previous anesthesia was also reviewed. The risks                            and benefits of the procedure and the sedation                            options and risks were discussed with the patient.                            All questions were answered, and informed consent                            was obtained. Prior Anticoagulants: The patient has                            taken no anticoagulant or antiplatelet agents. ASA                            Grade Assessment: II - A patient with mild systemic                            disease. After reviewing the risks and benefits,                            the patient was deemed in satisfactory condition to  undergo the procedure.                           After obtaining informed consent, the endoscope was                            passed under direct vision. Throughout the                            procedure, the patient's blood pressure, pulse, and                            oxygen saturations were monitored continuously. The                            GIF [id number] #[id number] was  introduced through the                            mouth, and advanced to the second part of duodenum.                            The upper GI endoscopy was accomplished without                            difficulty. The patient tolerated the procedure                            well. Scope In: Scope Out: Findings:                 A mild Schatzki ring was found at the                            gastroesophageal junction. A guidewire was placed                            and the scope was withdrawn. Dilation was performed                            with a Savary dilator with no resistance at 18 mm.                            The dilation site was examined and showed no change.                           The exam of the esophagus was otherwise normal.                            Excellent view of UES area and no web, diverticulum                            or stricture was encountered there  A few erosions with no bleeding and no stigmata of                            recent bleeding were found in the prepyloric region                            of the stomach. Biopsies were taken with a cold                            forceps for histology. (antrum and body in same                            pathology jar to r/o H pylori)                           The exam of the stomach was otherwise normal.                           The cardia and gastric fundus were normal on                            retroflexion.                           The examined duodenum was normal. Complications:            No immediate complications. Estimated Blood Loss:     Estimated blood loss was minimal. Impression:               - Mild Schatzki ring. Dilated.                           - Gastric erosions with no bleeding and no stigmata                            of recent bleeding. Biopsied. (doubtful clinical                            significance if H pylori negative)                            - Normal examined duodenum. Recommendation:           - Patient has a contact number available for                            emergencies. The signs and symptoms of potential                            delayed complications were discussed with the                            patient. Return to normal activities tomorrow.  Written discharge instructions were provided to the                            patient.                           - Resume previous diet.                           - Continue present medications.                           - Await pathology results.                           - Perform a modified barium swallow at appointment                            to be scheduled. Jeniffer Culliver L. Legrand, MD 11/19/2024 2:08:07 PM This report has been signed electronically.

## 2024-11-19 NOTE — Progress Notes (Signed)
 No significant changes to clinical history since GI office visit on 11/12/24.  The patient is appropriate for an endoscopic procedure in the ambulatory setting.  - Victory Brand, MD

## 2024-11-19 NOTE — Patient Instructions (Signed)
 YOU HAD AN ENDOSCOPIC PROCEDURE TODAY AT THE [facility]:   Refer to the procedure report that was given to you for any specific questions about what was found during the examination.  If the procedure report does not answer your questions, please call your gastroenterologist to clarify.  If you requested that your care partner not be given the details of your procedure findings, then the procedure report has been included in a sealed envelope for you to review at your convenience later.  YOU SHOULD EXPECT: Some feelings of bloating in the abdomen. Passage of more gas than usual.  Walking can help get rid of the air that was put into your GI tract during the procedure and reduce the bloating. If you had a lower endoscopy (such as a colonoscopy or flexible sigmoidoscopy) you may notice spotting of blood in your stool or on the toilet paper. If you underwent a bowel prep for your procedure, you may not have a normal bowel movement for a few days.  Please Note:  You might notice some irritation and congestion in your nose or some drainage.  This is from the oxygen used during your procedure.  There is no need for concern and it should clear up in a day or so.  SYMPTOMS TO REPORT IMMEDIATELY:   Following upper endoscopy (EGD)  Vomiting of blood or coffee ground material  New chest pain or pain under the shoulder blades  Painful or persistently difficult swallowing  New shortness of breath  Fever of 100F or higher  Black, tarry-looking stools  For urgent or emergent issues, a gastroenterologist can be reached at any hour by calling [phone]. Do not use MyChart messaging for urgent concerns.    DIET:  We do recommend a small meal at first, but then you may proceed to your regular diet.  Drink plenty of fluids but you should avoid alcoholic beverages for 24 hours.  MEDICATIONS: Continue present medications.  FOLLOW UP: Await pathology results. Perform a modified barium swallow  at appointment to be scheduled.  Thank you for allowing us  to provide for your healthcare needs today.  ACTIVITY:  You should plan to take it easy for the rest of today and you should NOT DRIVE or use heavy machinery until tomorrow (because of the sedation medicines used during the test).    FOLLOW UP: Our staff will call the number listed on your records the next business day following your procedure.  We will call around 7:15- 8:00 am to check on you and address any questions or concerns that you may have regarding the information given to you following your procedure. If we do not reach you, we will leave a message.     If any biopsies were taken you will be contacted by phone or by letter within the next 1-3 weeks.  Please call us  at [phone] if you have not heard about the biopsies in 3 weeks.    SIGNATURES/CONFIDENTIALITY: You and/or your care partner have signed paperwork which will be entered into your electronic medical record.  These signatures attest to the fact that that the information above on your After Visit Summary has been reviewed and is understood.  Full responsibility of the confidentiality of this discharge information lies with you and/or your care-partner.

## 2024-11-19 NOTE — Progress Notes (Signed)
 Pt sedate, gd SR's, VSS, report to RN

## 2024-11-20 ENCOUNTER — Telehealth: Payer: Self-pay | Admitting: *Deleted

## 2024-11-20 NOTE — Telephone Encounter (Signed)
" °  Follow up Call-     11/19/2024    1:22 PM 05/09/2023    1:09 PM  Call back number  Post procedure Call Back phone  # [phone]  Permission to leave phone message Yes Yes     Patient questions:  Do you have a fever, pain , or abdominal swelling? No. Pain Score  0 *  Have you tolerated food without any problems? Yes.    Have you been able to return to your normal activities? Yes.    Do you have any questions about your discharge instructions: Diet   No. Medications  No. Follow up visit  No.  Do you have questions or concerns about your Care? No.  Actions: * If pain score is 4 or above: No action needed, pain <4.   "

## 2024-11-27 ENCOUNTER — Other Ambulatory Visit: Payer: Self-pay

## 2024-11-27 ENCOUNTER — Telehealth: Payer: Self-pay

## 2024-11-27 ENCOUNTER — Other Ambulatory Visit (HOSPITAL_COMMUNITY): Payer: Self-pay | Admitting: Gastroenterology

## 2024-11-27 DIAGNOSIS — R1314 Dysphagia, pharyngoesophageal phase: Secondary | ICD-10-CM

## 2024-11-27 DIAGNOSIS — R1310 Dysphagia, unspecified: Secondary | ICD-10-CM

## 2024-11-27 LAB — SURGICAL PATHOLOGY

## 2024-11-27 NOTE — Telephone Encounter (Signed)
 Modified barium swallow study ordered and scheduled. Pt informed via VM and portal msg.

## 2024-12-23 ENCOUNTER — Encounter (HOSPITAL_COMMUNITY)

## 2025-03-26 ENCOUNTER — Encounter

## 2025-03-26 ENCOUNTER — Other Ambulatory Visit
